# Patient Record
Sex: Male | Born: 1995
Health system: Southern US, Community
[De-identification: ages and names within clinical notes are randomized; demographics above are authoritative.]

## PROBLEM LIST (undated history)

## (undated) DIAGNOSIS — Z8661 Personal history of infections of the central nervous system: Secondary | ICD-10-CM

## (undated) DIAGNOSIS — J4520 Mild intermittent asthma, uncomplicated: Secondary | ICD-10-CM

## (undated) DIAGNOSIS — F909 Attention-deficit hyperactivity disorder, unspecified type: Secondary | ICD-10-CM

## (undated) DIAGNOSIS — F419 Anxiety disorder, unspecified: Secondary | ICD-10-CM

## (undated) HISTORY — DX: Personal history of infections of the central nervous system: Z86.61

## (undated) HISTORY — DX: Attention-deficit hyperactivity disorder, unspecified type: F90.9

## (undated) HISTORY — DX: Anxiety disorder, unspecified: F41.9

## (undated) HISTORY — DX: Mild intermittent asthma, uncomplicated: J45.20

---
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
## 2013-01-08 DIAGNOSIS — J45909 Unspecified asthma, uncomplicated: Secondary | ICD-10-CM | POA: Insufficient documentation

## 2013-01-08 DIAGNOSIS — R070 Pain in throat: Secondary | ICD-10-CM | POA: Insufficient documentation

## 2013-01-08 DIAGNOSIS — J029 Acute pharyngitis, unspecified: Secondary | ICD-10-CM | POA: Insufficient documentation

## 2013-01-08 DIAGNOSIS — Z8661 Personal history of infections of the central nervous system: Secondary | ICD-10-CM | POA: Insufficient documentation

## 2013-01-08 DIAGNOSIS — R498 Other voice and resonance disorders: Secondary | ICD-10-CM | POA: Insufficient documentation

## 2013-01-08 NOTE — ED Notes (Signed)
Pt states that he had a pork chop a couple days ago and it feels like a piece of bone is in there. No distress.

## 2013-01-09 ENCOUNTER — Emergency Department (HOSPITAL_BASED_OUTPATIENT_CLINIC_OR_DEPARTMENT_OTHER): Payer: BC Managed Care – PPO

## 2013-01-09 ENCOUNTER — Encounter (HOSPITAL_BASED_OUTPATIENT_CLINIC_OR_DEPARTMENT_OTHER): Payer: Self-pay | Admitting: *Deleted

## 2013-01-09 ENCOUNTER — Emergency Department (HOSPITAL_BASED_OUTPATIENT_CLINIC_OR_DEPARTMENT_OTHER)
Admission: EM | Admit: 2013-01-09 | Discharge: 2013-01-09 | Disposition: A | Discharge status: Home / Self Care | Payer: BC Managed Care – PPO | Attending: Emergency Medicine | Admitting: Emergency Medicine

## 2013-01-09 DIAGNOSIS — R070 Pain in throat: Secondary | ICD-10-CM

## 2013-01-09 NOTE — ED Notes (Signed)
MD at bedside. 

## 2013-01-09 NOTE — ED Notes (Signed)
Patient back form Xray

## 2013-01-09 NOTE — ED Provider Notes (Addendum)
History    This chart was scribed for John Sprout, MD scribed by Magnus Sinning. The patient was seen in room MH10/MH10 at 00:48   CSN: [id number]  Arrival date & time 01/08/13  2345   None     Chief Complaint  Patient presents with  . Foreign Body    (Consider location/radiation/quality/duration/timing/severity/associated sxs/prior treatment) Patient is a 17 y.o. male presenting with foreign body. The history is provided by the patient. No language interpreter was used.  Foreign Body  Associated symptoms include sore throat. Pertinent negatives include no trouble swallowing.   John Kline is a 17 y.o. male who presents to the Emergency Department complaining of one day of mild throat soreness located on the left side due to incident where a piece of a pork chop was briefly stuck in throat when eating two days ago. He says he did not choke, but notes that he began having throat soreness the following day.He explains that he has felt that throat is scratched, which has caused difficulties in loud vocalization, as he is a Psychologist, occupational. He denies any difficulties swallowing or drinking. Past Medical History  Diagnosis Date  . Asthma   . Meningitis     History reviewed. No pertinent past surgical history.  History reviewed. No pertinent family history.  History  Substance Use Topics  . Smoking status: Not on file  . Smokeless tobacco: Not on file  . Alcohol Use: Not on file      Review of Systems  HENT: Positive for sore throat and voice change. Negative for trouble swallowing.    10 Systems reviewed and are negative for acute change except as noted in the HPI. Allergies  Penicillins and Sulfa antibiotics  Home Medications  No current outpatient prescriptions on file.  BP 135/75  Pulse 62  Temp(Src) 98.1 F (36.7 C) (Oral)  Resp 18  Ht 5\' 10"  (1.778 m)  Wt 185 lb (83.915 kg)  BMI 26.54 kg/m2  SpO2 95%  Physical Exam  Nursing note and vitals  reviewed. Constitutional: He is oriented to person, place, and time. He appears well-developed and well-nourished. No distress.  HENT:  Head: Normocephalic and atraumatic.  Eyes: Conjunctivae and EOM are normal.  Neck: Neck supple. No tracheal deviation present.  Mild tenderness to anterior neck to the left side of the cricoid.  Cardiovascular: Normal rate.   Pulmonary/Chest: Effort normal and breath sounds normal. No stridor. No respiratory distress.  Abdominal: He exhibits no distension.  Musculoskeletal: Normal range of motion.  Lymphadenopathy:    He has no cervical adenopathy.  Neurological: He is alert and oriented to person, place, and time. No sensory deficit.  Skin: Skin is dry.  Psychiatric: He has a normal mood and affect. His behavior is normal.    ED Course  Procedures (including critical care time) DIAGNOSTIC STUDIES: Oxygen Saturation is 95% on room air, adequate by my interpretation.    COORDINATION OF CARE:  00:49: Physical exam performed.  Labs Reviewed - No data to display Dg Neck Soft Tissue  01/09/2013  *RADIOLOGY REPORT*  Clinical Data: Foreign body.  The patient had a pork chop a couple of days ago and feels like a piece of bone remaining.  NECK SOFT TISSUES - 1+ VIEW  Comparison: Cervical spine 08/26/2009  Findings: Lateral soft tissue exposure of the neck demonstrates no radiopaque foreign bodies.  Epiglottis and aryepiglottic folds are not enlarged.  No prevertebral or submental soft tissue swelling. Visualized bones appear intact.  Or pharyngeal and  tracheal airway appears intact on single view.  IMPRESSION: No radiopaque foreign bodies demonstrated in the neck.   Original Report Authenticated By: Burman Nieves, M.D.      1. Pain in throat       MDM   Patient was eating a pork chop last night he states that he swallowed it felt like there may have been a bone in it which scratched his throat. He denied any choking or breathing difficulty. There was  mild pain last night but today he has had more significant pain in his throat with swallowing and yelling. He is in no acute distress and has some mild pain in his anterior neck just left of this cricoid.  No stridor or other concerning features such as drooling or inability to swallow. He'll most likely patient has a scratch in his esophagus from progress last night I will just need time to heal. Soft tissue neck pending  1:19 AM Soft tissue neck within normal limits and patient discharged home. I personally performed the services described in this documentation, which was scribed in my presence.  The recorded information has been reviewed and considered.         John Sprout, MD 01/09/13 2956  John Sprout, MD 01/09/13 2130  John Sprout, MD 01/09/13 8657

## 2013-03-08 ENCOUNTER — Institutional Professional Consult (permissible substitution): Payer: BC Managed Care – PPO | Admitting: Family

## 2013-03-08 DIAGNOSIS — F909 Attention-deficit hyperactivity disorder, unspecified type: Secondary | ICD-10-CM

## 2013-05-18 ENCOUNTER — Institutional Professional Consult (permissible substitution): Payer: BC Managed Care – PPO | Admitting: Family

## 2013-05-18 DIAGNOSIS — F909 Attention-deficit hyperactivity disorder, unspecified type: Secondary | ICD-10-CM

## 2013-06-28 ENCOUNTER — Ambulatory Visit: Payer: BC Managed Care – PPO | Admitting: Psychologist

## 2013-08-09 ENCOUNTER — Ambulatory Visit: Payer: BC Managed Care – PPO | Admitting: Psychologist

## 2013-08-09 ENCOUNTER — Institutional Professional Consult (permissible substitution): Payer: BC Managed Care – PPO | Admitting: Family

## 2013-09-23 ENCOUNTER — Institutional Professional Consult (permissible substitution): Payer: BC Managed Care – PPO | Admitting: Family

## 2013-09-23 DIAGNOSIS — F909 Attention-deficit hyperactivity disorder, unspecified type: Secondary | ICD-10-CM

## 2014-02-14 ENCOUNTER — Institutional Professional Consult (permissible substitution): Payer: BC Managed Care – PPO | Admitting: Family

## 2014-02-16 ENCOUNTER — Institutional Professional Consult (permissible substitution): Payer: BC Managed Care – PPO | Admitting: Family

## 2014-02-16 DIAGNOSIS — F909 Attention-deficit hyperactivity disorder, unspecified type: Secondary | ICD-10-CM

## 2014-05-10 ENCOUNTER — Institutional Professional Consult (permissible substitution): Payer: BC Managed Care – PPO | Admitting: Family

## 2014-05-10 DIAGNOSIS — F909 Attention-deficit hyperactivity disorder, unspecified type: Secondary | ICD-10-CM

## 2014-05-10 DIAGNOSIS — F411 Generalized anxiety disorder: Secondary | ICD-10-CM

## 2014-09-01 ENCOUNTER — Institutional Professional Consult (permissible substitution): Payer: BC Managed Care – PPO | Admitting: Family

## 2014-09-01 DIAGNOSIS — F900 Attention-deficit hyperactivity disorder, predominantly inattentive type: Secondary | ICD-10-CM

## 2014-10-09 ENCOUNTER — Encounter: Payer: Self-pay | Admitting: Family Medicine

## 2014-10-09 ENCOUNTER — Ambulatory Visit (INDEPENDENT_AMBULATORY_CARE_PROVIDER_SITE_OTHER): Payer: 59 | Admitting: Family Medicine

## 2014-10-09 VITALS — BP 133/85 | HR 104 | Temp 98.8°F | Resp 18 | Ht 69.75 in | Wt 207.0 lb

## 2014-10-09 DIAGNOSIS — F909 Attention-deficit hyperactivity disorder, unspecified type: Secondary | ICD-10-CM

## 2014-10-09 DIAGNOSIS — J4520 Mild intermittent asthma, uncomplicated: Secondary | ICD-10-CM

## 2014-10-09 NOTE — Progress Notes (Signed)
Office Note 10/09/2014  CC:  Chief Complaint  Patient presents with  . Establish Care   HPI:  John Kline is a 19 y.o. White male who is here to establish care. Patient's most recent primary MD: Dr. Excell Seltzer in [facility] (pediatrician).  ADHD managed by behavioral peds MD in [place]. Old records not reviewed before or during visit today.  He is without acute complaint.  Reviewed history. No asthmatic sx's in quite some time, says he has inhaler at home if he needs it.  Past Medical History  Diagnosis Date  . Mild intermittent asthma     childhood; quiescent for the most part as adult  . History of meningitis age 71    viral  . ADHD (attention deficit hyperactivity disorder)     dx'd approx 8th grade    History reviewed. No pertinent past surgical history.  Family History  Problem Relation Age of Onset  . Uterine cancer Maternal Grandmother   . Graves' disease Mother     History   Social History  . Marital Status: Single    Spouse Name: N/A    Number of Children: N/A  . Years of Education: N/A   Occupational History  . Not on file.   Social History Main Topics  . Smoking status: Former Smoker    Types: Cigarettes  . Smokeless tobacco: Never Used  . Alcohol Use: No  . Drug Use: No  . Sexual Activity: Not on file   Other Topics Concern  . Not on file   Social History Narrative   Student at [employer] from [employer] in [place].   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   No T/A/Ds.   Little brother, twin sister, and older sister.    Outpatient Encounter Prescriptions as of 10/09/2014  Medication Sig  . lisdexamfetamine (VYVANSE) 70 MG capsule Take 70 mg by mouth daily.  Marland Kitchen VYVANSE 40 MG capsule 40 mg daily.  70 mg qAM and 40 mg q 3PM  Allergies  Allergen Reactions  . Amoxicillin   . Penicillins Rash  . Sulfa Antibiotics Rash   ROS Review of Systems  Constitutional: Negative for fever and fatigue.  HENT: Negative for  congestion and sore throat.   Eyes: Negative for visual disturbance.  Respiratory: Negative for cough.   Cardiovascular: Negative for chest pain.  Gastrointestinal: Negative for nausea and abdominal pain.  Genitourinary: Negative for dysuria.  Musculoskeletal: Negative for back pain and joint swelling.  Skin: Negative for rash.  Neurological: Negative for weakness and headaches.  Hematological: Negative for adenopathy.     PE; Blood pressure 133/85, pulse 104, temperature 98.8 F (37.1 C), temperature source Temporal, resp. rate 18, height 5' 9.75" (1.772 m), weight 207 lb (93.895 kg), SpO2 99 %. Gen: Alert, well appearing.  Patient is oriented to person, place, time, and situation. ZOX:WRUE: no injection, icteris, swelling, or exudate.  EOMI, PERRLA. Mouth: lips without lesion/swelling.  Oral mucosa pink and moist. Oropharynx without erythema, exudate, or swelling.  CV: RRR, no m/r/g.   LUNGS: CTA bilat, nonlabored resps, good aeration in all lung fields. EXT: no clubbing, cyanosis, or edema.   Pertinent labs:  none  ASSESSMENT AND PLAN:   New pt: establishing care.  Will obtain vaccine records, pertinent records from behav pediatrician managing his ADHD.  1) Mild intermittent asthma: quiescent. He has albuterol inhaler on hand if he needs it.  2) ADHD, managed by behavioral pediatrician in [place] and he'll be sticking  with this MD for this. No changes made to regimen, no new rx's today.  Pt declined flu vaccine today.  F/u: prn (needs CPE q1-2 yrs).

## 2014-10-09 NOTE — Progress Notes (Signed)
Pre visit review using our clinic review tool, if applicable. No additional management support is needed unless otherwise documented below in the visit note. 

## 2014-12-11 ENCOUNTER — Other Ambulatory Visit: Payer: Self-pay | Admitting: Family Medicine

## 2014-12-11 NOTE — Telephone Encounter (Signed)
Pt needs a refill on his inhaler. Please call it in.

## 2014-12-11 NOTE — Telephone Encounter (Signed)
Patient does not have an inhaler in med list. LMOVM for call back.

## 2014-12-12 MED ORDER — ALBUTEROL SULFATE HFA 108 (90 BASE) MCG/ACT IN AERS: 1.0000 | INHALATION_SPRAY | Freq: Four times a day (QID) | RESPIRATORY_TRACT | Status: DC | PRN

## 2014-12-12 NOTE — Telephone Encounter (Signed)
Patient's mom Olegario MessierKathy, returned call. Olegario MessierKathy stated that pt uses proair inhaler and is needing refill. Med never filled by provider. Please advise refill?

## 2015-04-10 ENCOUNTER — Institutional Professional Consult (permissible substitution): Payer: 59 | Admitting: Family

## 2015-04-10 DIAGNOSIS — F902 Attention-deficit hyperactivity disorder, combined type: Secondary | ICD-10-CM | POA: Diagnosis not present

## 2015-09-25 ENCOUNTER — Institutional Professional Consult (permissible substitution): Payer: 59 | Admitting: Family

## 2015-09-25 DIAGNOSIS — F902 Attention-deficit hyperactivity disorder, combined type: Secondary | ICD-10-CM | POA: Diagnosis not present

## 2015-09-25 DIAGNOSIS — F411 Generalized anxiety disorder: Secondary | ICD-10-CM | POA: Diagnosis not present

## 2015-12-11 ENCOUNTER — Ambulatory Visit (INDEPENDENT_AMBULATORY_CARE_PROVIDER_SITE_OTHER): Payer: BLUE CROSS/BLUE SHIELD | Admitting: Family

## 2015-12-11 ENCOUNTER — Encounter: Payer: Self-pay | Admitting: Family

## 2015-12-11 VITALS — BP 104/72 | HR 68 | Resp 20 | Ht 69.75 in | Wt 175.0 lb

## 2015-12-11 DIAGNOSIS — F419 Anxiety disorder, unspecified: Secondary | ICD-10-CM | POA: Diagnosis not present

## 2015-12-11 DIAGNOSIS — F329 Major depressive disorder, single episode, unspecified: Secondary | ICD-10-CM

## 2015-12-11 DIAGNOSIS — F909 Attention-deficit hyperactivity disorder, unspecified type: Secondary | ICD-10-CM | POA: Insufficient documentation

## 2015-12-11 DIAGNOSIS — F902 Attention-deficit hyperactivity disorder, combined type: Secondary | ICD-10-CM | POA: Diagnosis not present

## 2015-12-11 DIAGNOSIS — F32A Depression, unspecified: Secondary | ICD-10-CM | POA: Insufficient documentation

## 2015-12-11 MED ORDER — AMPHETAMINE-DEXTROAMPHET ER 25 MG PO CP24: 25.0000 mg | ORAL_CAPSULE | Freq: Every evening | ORAL | Status: DC

## 2015-12-11 MED ORDER — AMPHETAMINE-DEXTROAMPHET ER 30 MG PO CP24: 30.0000 mg | ORAL_CAPSULE | Freq: Every morning | ORAL | Status: DC

## 2015-12-11 NOTE — Progress Notes (Signed)
[facility] [facility] [place] [facility] [address] [place] [address] Dept: [phone] Dept Fax: [phone] Loc: [phone] Loc Fax: [phone]  Medical Follow-up  Patient ID: John Kline, male  DOB: 11/28/1995, 20 y.o.  MRN: [id number]  Date of Evaluation: 12/11/15  PCP: Jeoffrey Massed, MD  Accompanied by: Self Patient Lives with: self  and roommate in dorm room HISTORY/CURRENT STATUS:  HPI  Patient here for routine follow up related to ADHD and medication management.  EDUCATION: School: [employer], [place] Year/Grade: Junior Homework Time: 2 Hours or more depending on class work Performance/Grades: above average Services: Other: Tutoring if needed Activities/Exercise: Current Exercise Habits: Home exercise routine, Type of exercise: calisthenics;treadmill, Time (Minutes): 30, Frequency (Times/Week): 6, Weekly Exercise (Minutes/Week): 180, Intensity: Moderate Exercise limited by: None identified   MEDICAL HISTORY: Appetite: good, more healthy choices and decreased carbohydrate intake MVI/Other: None Fruits/Vegs:increased recently Calcium: daily Iron:daily  Sleep: Bedtime: 11:30-12:00 am  Awakens: 7:30-8:00 am Sleep Concerns: Initiation/Maintenance/Other: Better sleep schedule  Individual Medical History/Review of System Changes? Yes food poisoning at school with illness in January for 3-4 days.  Allergies: Amoxicillin; Penicillins; and Sulfa antibiotics  Current Medications:  Current outpatient prescriptions:  .  amphetamine-dextroamphetamine (ADDERALL XR) 25 MG 24 hr capsule, Take 1 capsule by mouth every evening., Disp: 30 capsule, Rfl: 0 .  amphetamine-dextroamphetamine (ADDERALL XR) 30 MG 24 hr capsule, Take 1 capsule (30 mg total) by mouth every morning., Disp: 30 capsule, Rfl: 0 Medication Side Effects: None  Family Medical/Social  History Changes?: No  MENTAL HEALTH: Mental Health Issues: Recent break up with girlfriend that has been difficult  PHYSICAL EXAM: Vitals:  Today's Vitals   12/11/15 1446  BP: 104/72  Pulse: 68  Resp: 20  Height: 5' 9.75" (1.772 m)  Weight: 175 lb (79.379 kg)  , Facility age limit for growth percentiles is 20 years.  General Exam: Physical Exam  Constitutional: He is oriented to person, place, and time. He appears well-developed and well-nourished.  HENT:  Head: Normocephalic and atraumatic.  Right Ear: External ear normal.  Left Ear: External ear normal.  Nose: Nose normal.  Mouth/Throat: Oropharynx is clear and moist.  Eyes: Conjunctivae and EOM are normal. Pupils are equal, round, and reactive to light.  Neck: Normal range of motion. Neck supple.  Cardiovascular: Normal rate, regular rhythm, normal heart sounds and intact distal pulses.   Pulmonary/Chest: Effort normal and breath sounds normal.  Abdominal: Soft. Bowel sounds are normal.  Musculoskeletal: Normal range of motion.  Neurological: He is alert and oriented to person, place, and time. He has normal reflexes.  Skin: Skin is warm and dry.  Psychiatric: He has a normal mood and affect. His behavior is normal. Judgment and thought content normal.    Neurological: oriented to time, place, and person Cranial Nerves: normal  Neuromuscular:  Motor Mass: normal Tone: normal Strength: normal DTRs: 2+ and symmetric Overflow: none Reflexes: no tremors noted Sensory Exam: Vibratory: intact  Fine Touch: intact  Testing/Developmental Screens:  ASRS-9/9  DIAGNOSES:    ICD-9-CM ICD-10-CM   1. ADHD (attention deficit hyperactivity disorder), combined type 314.01 F90.2   2. Single current episode of major depressive disorder, unspecified depression episode severity (HCC) 296.20 F32.9   3. Anxiety disorder, unspecified 300.00 F41.9   4. Depression 311 F32.9     RECOMMENDATIONS: follow up when available for college  schedule  NEXT APPOINTMENT: Return in about 6 months (around 06/12/2016)  for Follow up appointment.   Carron Curieawn M. Paretta-Leahey, NP Counseling Time:30 mins Total Contact Time: 40 mins

## 2015-12-11 NOTE — Patient Instructions (Signed)
Follow up with counseling services as needed. Application for emotional support animal with type of documentation along with information to provide to college.

## 2016-01-24 ENCOUNTER — Other Ambulatory Visit: Payer: Self-pay | Admitting: Family

## 2016-01-24 MED ORDER — AMPHETAMINE-DEXTROAMPHET ER 30 MG PO CP24: 30.0000 mg | ORAL_CAPSULE | Freq: Every morning | ORAL | Status: DC

## 2016-01-24 MED ORDER — AMPHETAMINE-DEXTROAMPHET ER 25 MG PO CP24: 25.0000 mg | ORAL_CAPSULE | Freq: Every evening | ORAL | Status: DC

## 2016-01-24 NOTE — Telephone Encounter (Signed)
Mom called for refills for both Adderall prescriptions.  Patient last seen 12/11/15.  I called regarding a follow-up appointment, and mom said John Kline is having to work at school (in [place] through the summer and has made arrangements with the provider to defer a return appointment until November 2017.

## 2016-01-24 NOTE — Telephone Encounter (Signed)
Adderall XR 30 mg and Adderall XR 25 mg, 30 of each dispensed, 30 mg every morning and 25 mg every afternoon

## 2016-02-22 ENCOUNTER — Other Ambulatory Visit: Payer: Self-pay | Admitting: Family

## 2016-02-22 MED ORDER — AMPHETAMINE-DEXTROAMPHET ER 25 MG PO CP24: 25.0000 mg | ORAL_CAPSULE | Freq: Every evening | ORAL | Status: DC

## 2016-02-22 MED ORDER — AMPHETAMINE-DEXTROAMPHET ER 30 MG PO CP24: 30.0000 mg | ORAL_CAPSULE | Freq: Every morning | ORAL | Status: DC

## 2016-02-22 NOTE — Telephone Encounter (Signed)
Printed Rx and placed at front desk for pick-up-Adderall XR 30 mg and 25 mg. 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

## 2016-02-22 NOTE — Telephone Encounter (Addendum)
Mom called for refill for both Adderall prescriptions.  Patient last seen 12/11/15.  Left message for mom to call and schedule follow-up bas soon as possible. Mom called back and said the provider agreed to see the patient in December.

## 2016-03-24 ENCOUNTER — Other Ambulatory Visit: Payer: Self-pay | Admitting: Family

## 2016-03-24 MED ORDER — AMPHETAMINE-DEXTROAMPHET ER 30 MG PO CP24: 30.0000 mg | ORAL_CAPSULE | Freq: Every morning | ORAL | Status: DC

## 2016-03-24 MED ORDER — AMPHETAMINE-DEXTROAMPHET ER 25 MG PO CP24: 25.0000 mg | ORAL_CAPSULE | Freq: Every evening | ORAL | Status: DC

## 2016-03-24 NOTE — Telephone Encounter (Signed)
Mom called for both doses of Adderall.  Patient last seen 12/11/15.  Per mom, provider has spoken with patient about return appointment.

## 2016-03-24 NOTE — Telephone Encounter (Signed)
Spoke with provider. 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 He will be seen when he returns Printed Rx for Adderall XR 30 mg and Adderall XR 25 and placed at front desk for pick-up

## 2016-04-28 ENCOUNTER — Other Ambulatory Visit: Payer: Self-pay | Admitting: Family

## 2016-04-28 MED ORDER — AMPHETAMINE-DEXTROAMPHET ER 25 MG PO CP24: 25.0000 mg | ORAL_CAPSULE | Freq: Every evening | ORAL | 0 refills | Status: DC

## 2016-04-28 MED ORDER — AMPHETAMINE-DEXTROAMPHET ER 30 MG PO CP24: 30.0000 mg | ORAL_CAPSULE | Freq: Every morning | ORAL | 0 refills | Status: DC

## 2016-04-28 NOTE — Telephone Encounter (Signed)
Mom called for refill, did not specify medication.  Patient last seen 12/14/15.  Called mom, who said patient is in college in [place] and will not be home until around Thanksgiving.  She said the patient is in communication with the provider and will call to schedule his next appointment.

## 2016-04-28 NOTE — Telephone Encounter (Signed)
Printed Rx and placed at front desk for pick-up-Adderall XrR30 mg and 25 mg daily

## 2016-05-22 ENCOUNTER — Other Ambulatory Visit: Payer: Self-pay | Admitting: Family

## 2016-05-22 MED ORDER — AMPHETAMINE-DEXTROAMPHET ER 30 MG PO CP24: 30.0000 mg | ORAL_CAPSULE | Freq: Every morning | ORAL | 0 refills | Status: DC

## 2016-05-22 MED ORDER — AMPHETAMINE-DEXTROAMPHET ER 25 MG PO CP24: 25.0000 mg | ORAL_CAPSULE | Freq: Every evening | ORAL | 0 refills | Status: DC

## 2016-05-22 NOTE — Telephone Encounter (Signed)
Mom called for refill, did not specify medication.  Patient last seen 12/11/15.  Patient is in college in [place].

## 2016-05-22 NOTE — Telephone Encounter (Signed)
Printed Rx and placed at front desk for pick-up  

## 2016-06-27 ENCOUNTER — Other Ambulatory Visit: Payer: Self-pay | Admitting: Family

## 2016-06-27 MED ORDER — AMPHETAMINE-DEXTROAMPHET ER 25 MG PO CP24: 25.0000 mg | ORAL_CAPSULE | Freq: Every evening | ORAL | 0 refills | Status: DC

## 2016-06-27 MED ORDER — AMPHETAMINE-DEXTROAMPHET ER 30 MG PO CP24: 30.0000 mg | ORAL_CAPSULE | Freq: Every morning | ORAL | 0 refills | Status: DC

## 2016-06-27 NOTE — Telephone Encounter (Signed)
Mom called for refill, did not specify medication.  Patient last seen 12/11/15, next appointment 09/24/16.

## 2016-06-27 NOTE — Telephone Encounter (Signed)
Printed Rx and placed at front desk for pick-up-Adderall XR 25 mg and 30 mg 1 each daily.

## 2016-07-28 ENCOUNTER — Other Ambulatory Visit: Payer: Self-pay | Admitting: Family

## 2016-07-28 NOTE — Telephone Encounter (Signed)
Mom called for refills for both prescriptions.  Patient last seen 12/11/15, next appointment 09/24/16.

## 2016-07-29 MED ORDER — AMPHETAMINE-DEXTROAMPHET ER 25 MG PO CP24: 25.0000 mg | ORAL_CAPSULE | Freq: Every evening | ORAL | 0 refills | Status: DC

## 2016-07-29 MED ORDER — AMPHETAMINE-DEXTROAMPHET ER 30 MG PO CP24: 30.0000 mg | ORAL_CAPSULE | Freq: Every morning | ORAL | 0 refills | Status: DC

## 2016-07-29 NOTE — Telephone Encounter (Signed)
Printed Rx for Adderall XR 30 Q AM and Adderall XR 25 Q PM  and placed at front desk for pick-up. Patient has not been seen since 12/2015 and needs scheduled for an appointment

## 2016-08-21 ENCOUNTER — Other Ambulatory Visit: Payer: Self-pay | Admitting: Family

## 2016-08-21 NOTE — Telephone Encounter (Signed)
Mom called for refill for both Adderall prescriptions.  Patient last seen 12/11/15, next appointment 09/24/16.

## 2016-08-22 MED ORDER — AMPHETAMINE-DEXTROAMPHET ER 30 MG PO CP24: 30.0000 mg | ORAL_CAPSULE | Freq: Every morning | ORAL | 0 refills | Status: DC

## 2016-08-22 MED ORDER — AMPHETAMINE-DEXTROAMPHET ER 25 MG PO CP24: 25.0000 mg | ORAL_CAPSULE | Freq: Every evening | ORAL | 0 refills | Status: DC

## 2016-08-22 NOTE — Telephone Encounter (Signed)
Printed Rx and placed at front desk for pick-up-Adderall XR 25 mg and 30 mg each daily. Patient in [place] for college and has appt. Schedule for 09/24/16.

## 2016-09-19 ENCOUNTER — Telehealth: Payer: Self-pay | Admitting: Family

## 2016-09-19 ENCOUNTER — Institutional Professional Consult (permissible substitution): Payer: Self-pay | Admitting: Family

## 2016-09-19 NOTE — Telephone Encounter (Signed)
Mom called and left a  message that Sheria LangCameron had to  canceled due to emergency.

## 2016-09-23 ENCOUNTER — Encounter: Payer: Self-pay | Admitting: Family

## 2016-09-23 ENCOUNTER — Ambulatory Visit (INDEPENDENT_AMBULATORY_CARE_PROVIDER_SITE_OTHER): Payer: BLUE CROSS/BLUE SHIELD | Admitting: Family

## 2016-09-23 VITALS — BP 112/64 | HR 68 | Ht 69.75 in | Wt 165.2 lb

## 2016-09-23 DIAGNOSIS — F902 Attention-deficit hyperactivity disorder, combined type: Secondary | ICD-10-CM | POA: Diagnosis not present

## 2016-09-23 DIAGNOSIS — F419 Anxiety disorder, unspecified: Secondary | ICD-10-CM | POA: Diagnosis not present

## 2016-09-23 MED ORDER — AMPHETAMINE-DEXTROAMPHET ER 30 MG PO CP24: 30.0000 mg | ORAL_CAPSULE | Freq: Every morning | ORAL | 0 refills | Status: DC

## 2016-09-23 MED ORDER — AMPHETAMINE-DEXTROAMPHET ER 25 MG PO CP24: 25.0000 mg | ORAL_CAPSULE | Freq: Every evening | ORAL | 0 refills | Status: DC

## 2016-09-23 NOTE — Progress Notes (Signed)
[place] [facility] [place] [facility] [address] [place] [address] Dept: [phone] Dept Fax: [phone] Loc: [phone] Loc Fax: [phone]  Medical Follow-up  Patient ID: John Kline, male  DOB: 01/16/1996, 20 y.o.  MRN: [id number]  Date of Evaluation: 09/23/16  PCP: Jeoffrey MassedMCGOWEN,PHILIP H, MD  Accompanied by: Self Patient Lives with: Dorm room on campus  HISTORY/CURRENT STATUS:  HPI  Patient here for routine follow up related to ADHD and medication management. Patient here for today's follow up visit by himself today. Doing well at school and graduating in April. No side effects reports from medication regimen.   EDUCATION: School: [employer], [place] Year/Grade: Senior year Homework Time: Depends on class demands. Graduation is January 29, 2017. Performance/Grades: above average Services: Other: Tutoring if needed on campus Activities/Exercise: daily-working out routinely and ran 1st 5K. Running and gym to workout.  MEDICAL HISTORY: Appetite: Good, decreased carbs and protein. MVI/Other: None Fruits/Vegs:Some Calcium: Some Iron:Some  Sleep: Bedtime: 11:00 pm Awakens: 8:30 am Sleep Concerns: Initiation/Maintenance/Other: No problems reported  Individual Medical History/Review of System Changes? None reported recently.   Allergies: Amoxicillin; Penicillins; and Sulfa antibiotics  Current Medications:  Current Outpatient Prescriptions:  .  amphetamine-dextroamphetamine (ADDERALL XR) 25 MG 24 hr capsule, Take 1 capsule by mouth every evening., Disp: 30 capsule, Rfl: 0 .  amphetamine-dextroamphetamine (ADDERALL XR) 30 MG 24 hr capsule, Take 1 capsule (30 mg total) by mouth every morning., Disp: 30 capsule, Rfl: 0 Medication Side Effects: None  Family Medical/Social History Changes?: None reported recently.   MENTAL HEALTH: Mental Health  Issues: Anxiety-recently increased  PHYSICAL EXAM: Vitals:  Today's Vitals   09/23/16 1113  BP: 112/64  Pulse: 68  Weight: 165 lb 3.2 oz (74.9 kg)  Height: 5' 9.75" (1.772 m)  PainSc: 0-No pain  , Facility age limit for growth percentiles is 20 years.  General Exam: Physical Exam  Constitutional: He is oriented to person, place, and time. He appears well-developed and well-nourished.  HENT:  Head: Normocephalic and atraumatic.  Right Ear: External ear normal.  Left Ear: External ear normal.  Nose: Nose normal.  Mouth/Throat: Oropharynx is clear and moist.  Eyes: Conjunctivae and EOM are normal. Pupils are equal, round, and reactive to light.  Neck: Trachea normal, normal range of motion and full passive range of motion without pain. Neck supple.  Cardiovascular: Normal rate, regular rhythm, normal heart sounds and intact distal pulses.   Pulmonary/Chest: Effort normal and breath sounds normal.  Abdominal: Soft. Bowel sounds are normal.  Musculoskeletal: Normal range of motion.  Neurological: He is alert and oriented to person, place, and time. He has normal reflexes.  Skin: Skin is warm, dry and intact. Capillary refill takes less than 2 seconds.  Psychiatric: He has a normal mood and affect. His behavior is normal. Judgment and thought content normal.  Vitals reviewed.  No concerns for toileting. Daily stool, no constipation or diarrhea. Void urine no difficulty. No enuresis.   Participate in daily oral hygiene to include brushing and flossing.  Neurological: oriented to time, place, and person Cranial Nerves: normal  Neuromuscular:  Motor Mass: Normal Tone: Normal Strength: Normal DTRs: 2+ and symmetric Overflow: None Reflexes: no tremors noted Sensory Exam: Vibratory: Intact  Fine Touch: Intact  Testing/Developmental Screens:  ASRS-11/11 scored by patient and reviewed    DIAGNOSES:    ICD-9-CM ICD-10-CM   1. ADHD (attention deficit hyperactivity disorder),  combined type 314.01 F90.2  2. Anxiety disorder, unspecified type 300.00 F41.9     RECOMMENDATIONS: 3-6 month follow up and continuation of medication.  Adderall XR 30 mg and 25 mg 1 each daily, # 30 script printed and given to patient today.   Discussed exercise with healthy lifestyle changes and better eating habits over the past 6 months.   Continuation of daily oral hygiene to include flossing and brushing daily, using antimicrobial toothpaste, as well as routine dental exams and twice yearly cleaning.  Recommend supplementation with a multivitamin and omega-3 fatty acids daily.  Maintain adequate intake of Calcium and Vitamin D.  NEXT APPOINTMENT: Return in about 3 months (around 12/22/2016) for follow up visit.  More than 50% of the appointment was spent counseling and discussing diagnosis and management of symptoms with the patient and family.  Carron Curieawn M Paretta-Leahey, NP Counseling Time: 30 mins Total Contact Time: 40 mins

## 2016-09-24 ENCOUNTER — Institutional Professional Consult (permissible substitution): Payer: BLUE CROSS/BLUE SHIELD | Admitting: Family

## 2016-10-27 ENCOUNTER — Other Ambulatory Visit: Payer: Self-pay | Admitting: Family

## 2016-10-27 MED ORDER — AMPHETAMINE-DEXTROAMPHET ER 25 MG PO CP24: 25.0000 mg | ORAL_CAPSULE | Freq: Every evening | ORAL | 0 refills | Status: DC

## 2016-10-27 MED ORDER — AMPHETAMINE-DEXTROAMPHET ER 30 MG PO CP24: 30.0000 mg | ORAL_CAPSULE | Freq: Every morning | ORAL | 0 refills | Status: DC

## 2016-10-27 NOTE — Telephone Encounter (Signed)
Mom called for refill, did not specify medication.  Patient last seen 09/23/16, next appointment 02/02/17.  Do not mail, will pick up.

## 2016-10-27 NOTE — Telephone Encounter (Signed)
Printed Rx for Adderall XR 30 and Adderall XR 25 and placed at front desk for pick-up

## 2016-11-25 ENCOUNTER — Other Ambulatory Visit: Payer: Self-pay | Admitting: Family

## 2016-11-25 MED ORDER — AMPHETAMINE-DEXTROAMPHET ER 30 MG PO CP24: 30.0000 mg | ORAL_CAPSULE | Freq: Every morning | ORAL | 0 refills | Status: DC

## 2016-11-25 MED ORDER — AMPHETAMINE-DEXTROAMPHET ER 25 MG PO CP24: 25.0000 mg | ORAL_CAPSULE | Freq: Every evening | ORAL | 0 refills | Status: DC

## 2016-11-25 NOTE — Telephone Encounter (Signed)
Printed Rx for Adderall XR 30 mg and Adderall XR 25 mg and placed at front desk for pick-up

## 2016-11-25 NOTE — Telephone Encounter (Signed)
Mom called for refill for Adderall.  Patient last seen 09/23/16, next appointment 02/02/17.

## 2016-12-22 ENCOUNTER — Other Ambulatory Visit: Payer: Self-pay | Admitting: Family

## 2016-12-22 MED ORDER — AMPHETAMINE-DEXTROAMPHET ER 30 MG PO CP24: 30.0000 mg | ORAL_CAPSULE | Freq: Every morning | ORAL | 0 refills | Status: DC

## 2016-12-22 MED ORDER — AMPHETAMINE-DEXTROAMPHET ER 25 MG PO CP24: 25.0000 mg | ORAL_CAPSULE | Freq: Every evening | ORAL | 0 refills | Status: DC

## 2016-12-22 NOTE — Telephone Encounter (Signed)
Mom called for refill for Adderall.  Patient last seen 09/24/16, next appointment 02/02/17.

## 2016-12-22 NOTE — Telephone Encounter (Signed)
Printed Rx for Adderall XR 25 mg and 30 mg and placed at front desk for pick-up

## 2017-01-22 ENCOUNTER — Other Ambulatory Visit: Payer: Self-pay | Admitting: Family

## 2017-01-22 MED ORDER — AMPHETAMINE-DEXTROAMPHET ER 30 MG PO CP24: 30.0000 mg | ORAL_CAPSULE | Freq: Every morning | ORAL | 0 refills | Status: DC

## 2017-01-22 MED ORDER — AMPHETAMINE-DEXTROAMPHET ER 25 MG PO CP24: 25.0000 mg | ORAL_CAPSULE | Freq: Every evening | ORAL | 0 refills | Status: DC

## 2017-01-22 NOTE — Telephone Encounter (Signed)
TC from mother for refill on adderall 25 and 30 mg, printed and up front for mother to pick up

## 2017-01-22 NOTE — Telephone Encounter (Signed)
Mom called for refill for Adderall.  Patient last seen 09/23/16, next appointment 02/02/17.

## 2017-02-02 ENCOUNTER — Encounter: Payer: Self-pay | Admitting: Family

## 2017-02-02 ENCOUNTER — Ambulatory Visit (INDEPENDENT_AMBULATORY_CARE_PROVIDER_SITE_OTHER): Payer: BLUE CROSS/BLUE SHIELD | Admitting: Family

## 2017-02-02 VITALS — BP 112/68 | HR 76 | Ht 69.75 in | Wt 176.4 lb

## 2017-02-02 DIAGNOSIS — F419 Anxiety disorder, unspecified: Secondary | ICD-10-CM

## 2017-02-02 DIAGNOSIS — Z79899 Other long term (current) drug therapy: Secondary | ICD-10-CM | POA: Diagnosis not present

## 2017-02-02 DIAGNOSIS — F902 Attention-deficit hyperactivity disorder, combined type: Secondary | ICD-10-CM | POA: Diagnosis not present

## 2017-02-02 MED ORDER — AMPHETAMINE-DEXTROAMPHET ER 30 MG PO CP24: 30.0000 mg | ORAL_CAPSULE | Freq: Every morning | ORAL | 0 refills | Status: DC

## 2017-02-02 MED ORDER — AMPHETAMINE-DEXTROAMPHET ER 25 MG PO CP24: 25.0000 mg | ORAL_CAPSULE | Freq: Every evening | ORAL | 0 refills | Status: DC

## 2017-02-02 NOTE — Progress Notes (Signed)
[facility] [place] [address] [place] [place] [address] Dept: [phone] Dept Fax: [phone] Loc: [phone] Loc Fax: [phone]  Medical Follow-up  Patient ID: [place], male  DOB: 10/29/1995, 21 y.o.  MRN: [id number]  Date of Evaluation: 02/02/17  PCP: John Massed, MD  Accompanied by: Self Patient Lives with: mother and sister  HISTORY/CURRENT STATUS:  HPI  Patient here for routine follow up related to ADHD and medication management.Patient here by himself for today's follow up visit. Doing well and recently graduated from college this past Saturday. Back home at mother's house and will start working March 23, 2017 at [employer]. Has continued to take his medication regularly without any side effects reported, both Adderall XR 30 mg and 25 mg both daily. Has continued with physical activity routinely and watching what he is eating. [name] has kept his relationship with his [place] who is a Consulting civil engineer in [place] at school in her last year of college.   EDUCATION/WORK: School: [place] [place] Year/Grade: Senior-GRADUATED! Homework Time: None now Performance/Grades: above average Services: Other: None needed Activities/Exercise: every other day-running more, not much weight training recently.   MEDICAL HISTORY: Appetite: Good MVI/Other: None Fruits/Vegs:Some Calcium: Some Iron:Some  Sleep: Bedtime: 11:30 pm Awakens: 8:00 am Sleep Concerns: Initiation/Maintenance/Other: No problems reported.   Individual Medical History/Review of System Changes? Yes, had Mononucleosis recently in January with feeling better. No other recent health issues.   Allergies: Amoxicillin; Penicillins; and Sulfa antibiotics  Current Medications:  Current Outpatient Prescriptions:  .  amphetamine-dextroamphetamine (ADDERALL XR) 25 MG 24 hr capsule,  Take 1 capsule by mouth every evening., Disp: 30 capsule, Rfl: 0 .  amphetamine-dextroamphetamine (ADDERALL XR) 30 MG 24 hr capsule, Take 1 capsule (30 mg total) by mouth every morning., Disp: 30 capsule, Rfl: 0 Medication Side Effects: None  Family Medical/Social History Changes?: None   MENTAL HEALTH: Mental Health Issues: Anxiety-decreased now, but can increase with new job.   PHYSICAL EXAM: Vitals:  Today's Vitals   02/02/17 1436  BP: 112/68  Pulse: 76  Weight: 176 lb 6.4 oz (80 kg)  Height: 5' 9.75" (1.772 m)  PainSc: 0-No pain  , Facility age limit for growth percentiles is 20 years.  General Exam: Physical Exam  Constitutional: He is oriented to person, place, and time. He appears well-developed and well-nourished.  HENT:  Head: Normocephalic and atraumatic.  Right Ear: External ear normal.  Left Ear: External ear normal.  Nose: Nose normal.  Mouth/Throat: Oropharynx is clear and moist.  Eyes: Conjunctivae and EOM are normal. Pupils are equal, round, and reactive to light.  Neck: Trachea normal, normal range of motion and full passive range of motion without pain. Neck supple.  Cardiovascular: Normal rate, regular rhythm, normal heart sounds and intact distal pulses.   Pulmonary/Chest: Effort normal and breath sounds normal.  Abdominal: Soft. Bowel sounds are normal.  Musculoskeletal: Normal range of motion.  Neurological: He is alert and oriented to person, place, and time. He has normal reflexes.  Skin: Skin is warm, dry and intact. Capillary refill takes less than 2 seconds.  Psychiatric: He has a normal mood and affect. His behavior is normal. Judgment and thought content normal.  Vitals reviewed.  Review of Systems  Psychiatric/Behavioral: Positive for decreased concentration.  All other systems reviewed and are negative.  No concerns for toileting. Daily stool, no constipation or diarrhea. Void urine no difficulty. No enuresis.  Participate in daily oral  hygiene to include brushing and flossing.  Neurological: oriented to time, place, and person Cranial Nerves: normal  Neuromuscular:  Motor Mass: Normal Tone: Normal Strength: Normal DTRs: 2+ and symmetric Overflow: None Reflexes: no tremors noted Sensory Exam: Vibratory: Intact  Fine Touch: Intact  Testing/Developmental Screens:  ASRS-    DIAGNOSES:    ICD-9-CM ICD-10-CM   1. ADHD (attention deficit hyperactivity disorder), combined type 314.01 F90.2   2. Anxiety disorder, unspecified type 300.00 F41.9   3. Medication management V58.69 Z79.899     RECOMMENDATIONS: 3 month follow up and continuation of medication. Adderall XR 30 mg and 25 mg daily each day, # 30 each printed and given to patient today.  Advised patient to continue with medication as directed even with work to start in June.  Directed on following up with PCP for routine blood work and health maintenance.   Suggested continuation of physical activity regularly to maintain health and cardiovascular heath.   Counseled on sleep hygiene with change in schedule from school to work in the next few months.   Advised on [place] with long distance relationship and continuation of healthy interaction long distance.   NEXT APPOINTMENT: Return in about 3 months (around 05/04/2017) for follow up visit.  More than 50% of the appointment was spent counseling and discussing diagnosis and management of symptoms with the patient and family.  Carron Curie, NP Counseling Time: 30 mins Total Contact Time: 40 mins

## 2017-02-03 ENCOUNTER — Encounter: Payer: Self-pay | Admitting: Family Medicine

## 2017-03-24 ENCOUNTER — Other Ambulatory Visit: Payer: Self-pay | Admitting: Family

## 2017-03-24 MED ORDER — AMPHETAMINE-DEXTROAMPHET ER 25 MG PO CP24: 25.0000 mg | ORAL_CAPSULE | Freq: Every evening | ORAL | 0 refills | Status: DC

## 2017-03-24 MED ORDER — AMPHETAMINE-DEXTROAMPHET ER 30 MG PO CP24: 30.0000 mg | ORAL_CAPSULE | Freq: Every morning | ORAL | 0 refills | Status: DC

## 2017-03-24 NOTE — Telephone Encounter (Signed)
Mom called for refill, did not specify medication.  Patient last seen 02/02/17.

## 2017-03-24 NOTE — Telephone Encounter (Signed)
Printed Rx for Adderall XR 30 mg and Adderall XR 25 mg and placed at front desk for pick-up

## 2017-04-01 ENCOUNTER — Telehealth: Payer: Self-pay | Admitting: Family

## 2017-04-01 MED ORDER — AMPHETAMINE-DEXTROAMPHET ER 25 MG PO CP24: 25.0000 mg | ORAL_CAPSULE | Freq: Every evening | ORAL | 0 refills | Status: DC

## 2017-04-01 MED ORDER — AMPHETAMINE-DEXTROAMPHET ER 30 MG PO CP24: 30.0000 mg | ORAL_CAPSULE | Freq: Every morning | ORAL | 0 refills | Status: DC

## 2017-04-01 NOTE — Telephone Encounter (Signed)
Message on RN line needing 90 days scripts for mail in related to new job with change in insurance. To mail scripts to home address for Adderall XR 30 mg and 25 mg 1 each daily, # 90 for each medication with no refills.

## 2017-04-21 ENCOUNTER — Telehealth: Payer: Self-pay | Admitting: Family

## 2017-04-21 MED ORDER — AMPHETAMINE-DEXTROAMPHET ER 30 MG PO CP24: 30.0000 mg | ORAL_CAPSULE | Freq: Every morning | ORAL | 0 refills | Status: DC

## 2017-04-21 MED ORDER — AMPHETAMINE-DEXTROAMPHET ER 25 MG PO CP24: 25.0000 mg | ORAL_CAPSULE | Freq: Every evening | ORAL | 0 refills | Status: DC

## 2017-04-21 NOTE — Telephone Encounter (Signed)
Printed Rx and placed at front desk for pick-up-Adderall XR 30 mg and 25 mg each daily.

## 2017-05-19 ENCOUNTER — Telehealth: Payer: Self-pay | Admitting: Pediatrics

## 2017-05-19 NOTE — Telephone Encounter (Signed)
PA submitted via Cover My Meds.  Approved for 30 mg, initially denied for 25 mg 9 may not need second dose PA)

## 2017-07-13 ENCOUNTER — Other Ambulatory Visit: Payer: Self-pay | Admitting: Family

## 2017-07-13 MED ORDER — AMPHETAMINE-DEXTROAMPHET ER 30 MG PO CP24: 30.0000 mg | ORAL_CAPSULE | Freq: Every morning | ORAL | 0 refills | Status: DC

## 2017-07-13 MED ORDER — AMPHETAMINE-DEXTROAMPHET ER 25 MG PO CP24: 25.0000 mg | ORAL_CAPSULE | Freq: Every evening | ORAL | 0 refills | Status: DC

## 2017-07-13 NOTE — Telephone Encounter (Signed)
Patient called for refill for Adderall.  Patient last see 02/02/17, next appointment 09/15/17.

## 2017-07-13 NOTE — Telephone Encounter (Signed)
Printed Rx for Adderall XR 25 mg and Adderal XR 30 mg (90 days supply)  and placed at front desk for pick-up

## 2017-09-15 ENCOUNTER — Institutional Professional Consult (permissible substitution): Payer: 59 | Admitting: Family

## 2017-10-05 ENCOUNTER — Emergency Department (HOSPITAL_BASED_OUTPATIENT_CLINIC_OR_DEPARTMENT_OTHER)
Admission: EM | Admit: 2017-10-05 | Discharge: 2017-10-06 | Disposition: A | Discharge status: Home / Self Care | Payer: 59 | Attending: Physician Assistant | Admitting: Physician Assistant

## 2017-10-05 ENCOUNTER — Other Ambulatory Visit: Payer: Self-pay

## 2017-10-05 ENCOUNTER — Emergency Department (HOSPITAL_BASED_OUTPATIENT_CLINIC_OR_DEPARTMENT_OTHER): Payer: 59

## 2017-10-05 ENCOUNTER — Encounter (HOSPITAL_BASED_OUTPATIENT_CLINIC_OR_DEPARTMENT_OTHER): Payer: Self-pay | Admitting: *Deleted

## 2017-10-05 DIAGNOSIS — R1013 Epigastric pain: Secondary | ICD-10-CM

## 2017-10-05 DIAGNOSIS — R197 Diarrhea, unspecified: Secondary | ICD-10-CM | POA: Diagnosis not present

## 2017-10-05 DIAGNOSIS — Z87891 Personal history of nicotine dependence: Secondary | ICD-10-CM | POA: Insufficient documentation

## 2017-10-05 DIAGNOSIS — R112 Nausea with vomiting, unspecified: Secondary | ICD-10-CM | POA: Insufficient documentation

## 2017-10-05 DIAGNOSIS — J45909 Unspecified asthma, uncomplicated: Secondary | ICD-10-CM | POA: Diagnosis not present

## 2017-10-05 LAB — URINALYSIS, ROUTINE W REFLEX MICROSCOPIC
Bilirubin Urine: NEGATIVE
GLUCOSE, UA: NEGATIVE mg/dL
Hgb urine dipstick: NEGATIVE
KETONES UR: NEGATIVE mg/dL
LEUKOCYTES UA: NEGATIVE
Nitrite: NEGATIVE
PH: 5.5 (ref 5.0–8.0)
Protein, ur: NEGATIVE mg/dL
Specific Gravity, Urine: <1.005 — ABNORMAL LOW (ref 1.005–1.030)

## 2017-10-05 LAB — COMPREHENSIVE METABOLIC PANEL
ALBUMIN: 4.3 g/dL (ref 3.5–5.0)
ALT: 32 U/L (ref 17–63)
AST: 24 U/L (ref 15–41)
Alkaline Phosphatase: 76 U/L (ref 38–126)
Anion gap: 8 (ref 5–15)
BUN: 15 mg/dL (ref 6–20)
CHLORIDE: 102 mmol/L (ref 101–111)
CO2: 27 mmol/L (ref 22–32)
Calcium: 9.2 mg/dL (ref 8.9–10.3)
Creatinine, Ser: 0.94 mg/dL (ref 0.61–1.24)
GFR calc Af Amer: >60 mL/min (ref >60)
GFR calc non Af Amer: >60 mL/min (ref >60)
GLUCOSE: 116 mg/dL — AB (ref 65–99)
POTASSIUM: 3.4 mmol/L — AB (ref 3.5–5.1)
SODIUM: 137 mmol/L (ref 135–145)
Total Bilirubin: 1 mg/dL (ref 0.3–1.2)
Total Protein: 7.3 g/dL (ref 6.5–8.1)

## 2017-10-05 LAB — CBC
HEMATOCRIT: 47.2 % (ref 39.0–52.0)
Hemoglobin: 17 g/dL (ref 13.0–17.0)
MCH: 31 pg (ref 26.0–34.0)
MCHC: 36 g/dL (ref 30.0–36.0)
MCV: 86.1 fL (ref 78.0–100.0)
Platelets: 179 10*3/uL (ref 150–400)
RBC: 5.48 MIL/uL (ref 4.22–5.81)
RDW: 11.3 % — AB (ref 11.5–15.5)
WBC: 8.4 10*3/uL (ref 4.0–10.5)

## 2017-10-05 LAB — LIPASE, BLOOD: LIPASE: 31 U/L (ref 11–51)

## 2017-10-05 MED ORDER — FAMOTIDINE 20 MG PO TABS
20.0000 mg | ORAL_TABLET | Freq: Once | ORAL | Status: AC
  Administered 2017-10-05: 20 mg via ORAL
  Filled 2017-10-05: qty 1

## 2017-10-05 MED ORDER — FAMOTIDINE IN NACL 20-0.9 MG/50ML-% IV SOLN
20.0000 mg | Freq: Once | INTRAVENOUS | Status: AC
  Administered 2017-10-05: 20 mg via INTRAVENOUS
  Filled 2017-10-05: qty 50

## 2017-10-05 MED ORDER — DIPHENHYDRAMINE HCL 50 MG/ML IJ SOLN
25.0000 mg | Freq: Once | INTRAMUSCULAR | Status: AC
  Administered 2017-10-05: 25 mg via INTRAVENOUS
  Filled 2017-10-05: qty 1

## 2017-10-05 MED ORDER — IOPAMIDOL (ISOVUE-300) INJECTION 61%
100.0000 mL | Freq: Once | INTRAVENOUS | Status: AC | PRN
  Administered 2017-10-05: 100 mL via INTRAVENOUS

## 2017-10-05 MED ORDER — METHYLPREDNISOLONE SODIUM SUCC 125 MG IJ SOLR
125.0000 mg | Freq: Once | INTRAMUSCULAR | Status: AC
  Administered 2017-10-05: 125 mg via INTRAVENOUS
  Filled 2017-10-05: qty 2

## 2017-10-05 MED ORDER — EPINEPHRINE 0.3 MG/0.3ML IJ SOAJ
0.3000 mg | Freq: Once | INTRAMUSCULAR | Status: AC
  Administered 2017-10-05: 0.3 mg via INTRAMUSCULAR
  Filled 2017-10-05: qty 0.3

## 2017-10-05 MED ORDER — SODIUM CHLORIDE 0.9 % IV BOLUS (SEPSIS)
1000.0000 mL | Freq: Once | INTRAVENOUS | Status: AC
  Administered 2017-10-05: 1000 mL via INTRAVENOUS

## 2017-10-05 MED ORDER — ONDANSETRON HCL 4 MG/2ML IJ SOLN
4.0000 mg | Freq: Once | INTRAMUSCULAR | Status: AC
  Administered 2017-10-05: 4 mg via INTRAVENOUS
  Filled 2017-10-05: qty 2

## 2017-10-05 MED ORDER — SODIUM CHLORIDE 0.9 % IV SOLN
Freq: Once | INTRAVENOUS | Status: AC
  Administered 2017-10-05: 20:00:00 via INTRAVENOUS

## 2017-10-05 NOTE — ED Notes (Signed)
Patient was given sprite to sip on.

## 2017-10-05 NOTE — ED Provider Notes (Signed)
[facility] EMERGENCY DEPARTMENT Provider Note   CSN: [id number] Arrival date & time: 10/05/17  1726     History   Chief Complaint Chief Complaint  Patient presents with  . Abdominal Pain    HPI John [place]line is a 21 y.o. male.  HPI   [place]line is a 21 y.o. male, with a history of meningitis in childhood, presenting to the ED with sudden onset of abdominal pain around 4AM this morning, followed shortly thereafter by nausea, vomiting, and diarrhea.  Endorses 2 episodes of emesis and about 3 episodes of watery diarrhea.  Abdominal pain is now epigastric and periumbilical, intermittent, cramping, 6-8/10, nonradiating.  Does not seem to be affected by eating.  Patient notes a previous history of abdominal discomfort and overall "not feeling well," with ingestion of alcohol.  States this reaction has intensified over the past several months.  He characterizes his alcohol intake as occasional.  Last alcohol intake was last night.  Denies fever/chills, rash, neck pain/stiffness, cough, shortness of breath, chest pain, or any other complaints.   Past Medical History:  Diagnosis Date  . ADHD (attention deficit hyperactivity disorder)    dx'd approx 8th grade.  [facility] [place]  Marland Kitchen History of meningitis age 14   viral  . Mild intermittent asthma    childhood; quiescent for the most part as adult    Patient Active Problem List   Diagnosis Date Noted  . Anxiety disorder 12/11/2015  . Depression 12/11/2015    History reviewed. No pertinent surgical history.     Home Medications    Prior to Admission medications   Medication Sig Start Date End Date Taking? Authorizing Provider  amphetamine-dextroamphetamine (ADDERALL XR) 25 MG 24 hr capsule Take 1 capsule by mouth every evening. 3 month supply 07/13/17   Lorina Rabon, NP  amphetamine-dextroamphetamine (ADDERALL XR) 30 MG 24 hr capsule Take 1 capsule (30 mg total)  by mouth every morning. 3 month supply 07/13/17   Dedlow, Ether Griffins, NP  EPINEPHrine (EPIPEN 2-PAK) 0.3 mg/0.3 mL IJ SOAJ injection Inject 0.3 mLs (0.3 mg total) into the muscle once for 1 dose. 10/06/17 10/06/17  Joy, Shawn C, PA-C  famotidine (PEPCID) 20 MG tablet Take 1 tablet (20 mg total) by mouth 2 (two) times daily for 3 days. 10/05/17 10/08/17  Joy, Shawn C, PA-C  predniSONE (STERAPRED UNI-PAK 21 TAB) 10 MG (21) TBPK tablet Take 6 tabs (60mg ) on day 1, 5 tabs (50mg ) on day 2, 4 tabs (40mg ) on day 3, 3 tabs (30mg ) on day 4, 2 tabs (20mg ) on day 5, and 1 tab (10mg ) on day 6. 10/05/17   Joy, Shawn C, PA-C  promethazine (PHENERGAN) 25 MG tablet Take 1 tablet (25 mg total) by mouth every 6 (six) hours as needed for nausea or vomiting. 10/06/17   Joy, Hillard Danker, PA-C    Family History Family History  Problem Relation Age of Onset  . Graves' disease Mother   . Uterine cancer Maternal Grandmother     Social History Social History   Tobacco Use  . Smoking status: Former Smoker    Types: Cigarettes  . Smokeless tobacco: Never Used  Substance Use Topics  . Alcohol use: Yes    Alcohol/week: 1.2 oz    Types: 2 Standard drinks or equivalent per week  . Drug use: No     Allergies   Zofran [ondansetron hcl]; Amoxicillin; Penicillins; and Sulfa antibiotics   Review of Systems Review  of Systems  Constitutional: Negative for chills and fever.  Respiratory: Negative for cough and shortness of breath.   Cardiovascular: Negative for chest pain.  Gastrointestinal: Positive for abdominal pain, diarrhea, nausea and vomiting.  Genitourinary: Negative for dysuria and hematuria.  Musculoskeletal: Negative for neck pain and neck stiffness.  Skin: Negative for rash.  All other systems reviewed and are negative.    Physical Exam Updated Vital Signs BP 109/72 (BP Location: Right Arm)   Pulse 94   Temp 98.2 F (36.8 C) (Oral)   Resp 18   Ht 5' 9.75" (1.772 m)   Wt 78.5 kg (173 lb)   SpO2 100%   BMI  25.00 kg/m   Physical Exam  Constitutional: He appears well-developed and well-nourished. No distress.  HENT:  Head: Normocephalic and atraumatic.  Eyes: Conjunctivae are normal.  Neck: Neck supple.  Cardiovascular: Normal rate, regular rhythm, normal heart sounds and intact distal pulses.  Pulmonary/Chest: Effort normal and breath sounds normal. No respiratory distress.  Abdominal: Soft. There is tenderness. There is no guarding.    Musculoskeletal: He exhibits no edema.  Lymphadenopathy:    He has no cervical adenopathy.  Neurological: He is alert.  Skin: Skin is warm and dry. He is not diaphoretic.  Psychiatric: He has a normal mood and affect. His behavior is normal.  Nursing note and vitals reviewed.    ED Treatments / Results  Labs (all labs ordered are listed, but only abnormal results are displayed) Labs Reviewed  COMPREHENSIVE METABOLIC PANEL - Abnormal; Notable for the following components:      Result Value   Potassium 3.4 (*)    Glucose, Bld 116 (*)    All other components within normal limits  CBC - Abnormal; Notable for the following components:   RDW 11.3 (*)    All other components within normal limits  URINALYSIS, ROUTINE W REFLEX MICROSCOPIC - Abnormal; Notable for the following components:   Specific Gravity, Urine <1.005 (*)    All other components within normal limits  LIPASE, BLOOD    EKG  EKG Interpretation None       Radiology Ct Abdomen Pelvis W Contrast  Result Date: 10/05/2017 CLINICAL DATA:  Abdominal pain since 4 a.m.  Diarrhea. EXAM: CT ABDOMEN AND PELVIS WITH CONTRAST TECHNIQUE: Multidetector CT imaging of the abdomen and pelvis was performed using the standard protocol following bolus administration of intravenous contrast. CONTRAST:  ISOVUE-300 IOPAMIDOL (ISOVUE-300) INJECTION 61% COMPARISON:  None. FINDINGS: Lower chest: The lung bases are clear without focal nodule, mass, or airspace disease. Heart size is normal. No  significant pleural or pericardial effusion is present. Hepatobiliary: No focal liver abnormality is seen. No gallstones, gallbladder wall thickening, or biliary dilatation. Pancreas: Unremarkable. No pancreatic ductal dilatation or surrounding inflammatory changes. Spleen: Normal in size without focal abnormality. Adrenals/Urinary Tract: The adrenal glands are normal bilaterally. Kidneys and ureters are within normal limits. The urinary bladder is unremarkable. Stomach/Bowel: Stomach is mildly distended. There is no mechanical obstruction. Duodenum is within normal limits. The small bowel is unremarkable. Terminal ileum is within normal limits. The appendix is visualized and normal. The ascending and transverse colon are within normal limits. The descending and sigmoid colon are normal. Vascular/Lymphatic: No significant vascular findings are present. No enlarged abdominal or pelvic lymph nodes. Reproductive: Prostate is unremarkable. Other: No abdominal wall hernia or abnormality. No abdominopelvic ascites. Musculoskeletal: Vertebral body heights and alignment are normal. The bony pelvis is intact. The hips are located and within normal limits  bilaterally. IMPRESSION: 1. Mild distension of the stomach without mechanical obstruction. This could be contributing to patient's pain. 2. No other acute or focal lesion to explain the patient's symptoms. The abdomen is otherwise normal. Electronically Signed   By: Marin Robertshristopher  Mattern M.D.   On: 10/05/2017 21:55    Procedures .Critical Care Performed by: Anselm PancoastJoy, Shawn C, PA-C Authorized by: Anselm PancoastJoy, Shawn C, PA-C   Critical care provider statement:    Critical care time (minutes):  35   Critical care time was exclusive of:  Separately billable procedures and treating other patients   Critical care was necessary to treat or prevent imminent or life-threatening deterioration of the following conditions:  Shock   Critical care was time spent personally by me on the  following activities:  Examination of patient, evaluation of patient's response to treatment, re-evaluation of patient's condition, pulse oximetry, ordering and review of radiographic studies, ordering and review of laboratory studies, ordering and performing treatments and interventions, obtaining history from patient or surrogate and development of treatment plan with patient or surrogate   I assumed direction of critical care for this patient from another provider in my specialty: no     (including critical care time)  Medications Ordered in ED Medications  ondansetron (ZOFRAN) injection 4 mg (4 mg Intravenous Given 10/05/17 1750)  famotidine (PEPCID) tablet 20 mg (20 mg Oral Given 10/05/17 1839)  methylPREDNISolone sodium succinate (SOLU-MEDROL) 125 mg/2 mL injection 125 mg (125 mg Intravenous Given 10/05/17 1839)  diphenhydrAMINE (BENADRYL) injection 25 mg (25 mg Intravenous Given 10/05/17 1839)  diphenhydrAMINE (BENADRYL) injection 25 mg (25 mg Intravenous Given 10/05/17 1957)  famotidine (PEPCID) IVPB 20 mg premix (0 mg Intravenous Stopped 10/05/17 2036)  EPINEPHrine (EPI-PEN) injection 0.3 mg (0.3 mg Intramuscular Given 10/05/17 2006)  0.9 %  sodium chloride infusion ( Intravenous Stopped 10/05/17 2036)  sodium chloride 0.9 % bolus 1,000 mL (0 mLs Intravenous Stopped 10/05/17 2325)  iopamidol (ISOVUE-300) 61 % injection 100 mL (100 mLs Intravenous Contrast Given 10/05/17 2124)     Initial Impression / Assessment and Plan / ED Course  I have reviewed the triage vital signs and the nursing notes.  Pertinent labs & imaging results that were available during my care of the patient were reviewed by me and considered in my medical decision making (see chart for details).  Clinical Course as of Oct 07 127  Mon Oct 05, 2017  1830 Was asked to see patient in triage by RN. RN gave Zofran, 15 min later patient began to have a pruritic rash on his arms, chest, and back that was not present  prior to administration of Zofran. Patient denies shortness of breath, chest pain, vomiting, throat or intraoral swelling, or any other complaints.   [SJ]  2012 Patient looks much better following Epi. States he feels much better. Hives and red skin tone appears to be resolving. Blood pressure has improved.   [SJ]  2248 Discussed imaging results with the patient. He states his symptoms have completely resolved. Patient noted to have no abdominal tenderness.  Skin tone is normal.  [SJ]    Clinical Course User Index [SJ] Joy, Shawn C, PA-C    The patient presented with complaint of abdominal pain, nausea, vomiting, and diarrhea.  During initial triage assessment, patient was given Zofran.  Patient then began to have symptoms of allergic reaction.  Patient eventually required epinephrine IM, after which patient showed significant improvement.  Patient was observed with no noted rebound of symptoms.  Patient  able to tolerate PO.  Repeat abdominal exams benign.  Follow-up with PCP, GI, and allergist. The patient was given instructions for home care as well as return precautions. Patient voices understanding of these instructions, accepts the plan, and is comfortable with discharge.   Findings and plan of care discussed with Bary Castillaourteney Mackuen, MD. Dr. Corlis LeakMackuen personally evaluated and examined this patient.   Vitals:   10/05/17 2006 10/05/17 2010 10/05/17 2015 10/05/17 2030  BP: (!) 58/37 112/74 (!) 125/48 (!) 143/63  Pulse: 100  74 94  Resp: 19 19 20 17   Temp:      TempSrc:      SpO2: (!) 86%  98% 96%  Weight:      Height:      Patient's drop in blood pressure occurred right after patient had a bowel movement and then tried to stand up.  It may have been a vasovagal reaction, however, in light of the patient's hives and other signs of allergic reaction, he was treated as if it was an anaphylactic reaction.   Vitals:   10/05/17 2015 10/05/17 2030 10/05/17 2100 10/05/17 2239  BP: (!) 125/48  (!) 143/63 121/74 107/68  Pulse: 74 94 89 89  Resp: 20 17 19 19   Temp:      TempSrc:      SpO2: 98% 96% 99% 98%  Weight:      Height:         Final Clinical Impressions(s) / ED Diagnoses   Final diagnoses:  Epigastric pain    ED Discharge Orders        Ordered    EPINEPHrine (EPIPEN 2-PAK) 0.3 mg/0.3 mL IJ SOAJ injection   Once     10/06/17 0034    promethazine (PHENERGAN) 25 MG tablet  Every 6 hours PRN     10/06/17 0040    famotidine (PEPCID) 20 MG tablet  2 times daily     10/06/17 0034    predniSONE (STERAPRED UNI-PAK 21 TAB) 10 MG (21) TBPK tablet     10/06/17 0034       Anselm PancoastJoy, Shawn C, PA-C 10/06/17 0139    Anselm PancoastJoy, Shawn C, PA-C 10/06/17 0140    Abelino DerrickMackuen, Courteney Lyn, MD 10/06/17 2355

## 2017-10-05 NOTE — ED Notes (Signed)
Pt presents with new rash while in waiting room. RN informed. PA in triage now.

## 2017-10-05 NOTE — ED Triage Notes (Signed)
Abdominal pain since 4 am. Vomited x 1 this am. Gas like pain. Diarrhea x 3 today.

## 2017-10-05 NOTE — ED Notes (Signed)
Pt still red all over. Pt complains of itching.

## 2017-10-05 NOTE — Discharge Instructions (Addendum)
You seem to have had an allergic reaction, which may have been from Zofran (ondansetron).  Do not take this medication again.  You may use the phenergan, as needed, for nausea/vomiting.    Allergic Reaction Instructions:  Benadryl: Take 25 mg of Benadryl every 6 hours for the next 24 hours.  Use caution as Benadryl can make you drowsy. Pepcid: Take the Pepcid, as prescribed, over the next 3 days. Prednisone: Take prednisone, as prescribed, until finished. Epinephrine: This medication is to be used as needed for a severe allergic reaction called anaphylaxis. If you must use this medication, you should then call 911 or proceed to the nearest emergency room for continued evaluation and treatment.  Follow-up with your primary care provider on this matter.  Allergy testing with an allergist may be warranted.  Return to the ED for worsening symptoms, shortness of breath, chest pain, palpitations, persistent vomiting, facial or throat swelling, or any other major concerns.

## 2017-10-06 MED ORDER — FAMOTIDINE 20 MG PO TABS: 20.0000 mg | ORAL_TABLET | Freq: Two times a day (BID) | ORAL | 0 refills | Status: DC

## 2017-10-06 MED ORDER — PROMETHAZINE HCL 25 MG PO TABS: 25.0000 mg | ORAL_TABLET | Freq: Four times a day (QID) | ORAL | 0 refills | Status: DC | PRN

## 2017-10-06 MED ORDER — EPINEPHRINE 0.3 MG/0.3ML IJ SOAJ: 0.3000 mg | Freq: Once | INTRAMUSCULAR | 1 refills | Status: AC

## 2017-10-06 MED ORDER — PREDNISONE 10 MG (21) PO TBPK: ORAL_TABLET | ORAL | 0 refills | Status: DC

## 2017-10-22 ENCOUNTER — Other Ambulatory Visit: Payer: Self-pay | Admitting: Family

## 2017-10-22 NOTE — Telephone Encounter (Signed)
Patient called for refill for Amphetmine Salts, requesting a 90-day supply.  Patient last seen 02/02/17, next appointment 12/07/17.

## 2017-10-23 MED ORDER — AMPHETAMINE-DEXTROAMPHET ER 30 MG PO CP24: 30.0000 mg | ORAL_CAPSULE | Freq: Every morning | ORAL | 0 refills | Status: DC

## 2017-10-23 MED ORDER — AMPHETAMINE-DEXTROAMPHET ER 25 MG PO CP24: 25.0000 mg | ORAL_CAPSULE | Freq: Every evening | ORAL | 0 refills | Status: DC

## 2017-10-23 NOTE — Telephone Encounter (Signed)
Printed Rx and placed at front desk for pick-up-Adderall XR 30 mg and 25 mg each # 90 (3 month supply). Has appointment on 12/07/17.

## 2017-12-07 ENCOUNTER — Ambulatory Visit: Payer: 59 | Admitting: Family

## 2017-12-07 ENCOUNTER — Encounter: Payer: Self-pay | Admitting: Family

## 2017-12-07 VITALS — BP 118/78 | HR 76 | Resp 16 | Ht 69.75 in | Wt 178.2 lb

## 2017-12-07 DIAGNOSIS — F419 Anxiety disorder, unspecified: Secondary | ICD-10-CM | POA: Diagnosis not present

## 2017-12-07 DIAGNOSIS — F902 Attention-deficit hyperactivity disorder, combined type: Secondary | ICD-10-CM

## 2017-12-07 DIAGNOSIS — Z79899 Other long term (current) drug therapy: Secondary | ICD-10-CM | POA: Diagnosis not present

## 2017-12-07 DIAGNOSIS — Z719 Counseling, unspecified: Secondary | ICD-10-CM | POA: Diagnosis not present

## 2017-12-07 MED ORDER — AMPHETAMINE-DEXTROAMPHET ER 30 MG PO CP24: 30.0000 mg | ORAL_CAPSULE | Freq: Every morning | ORAL | 0 refills | Status: DC

## 2017-12-07 MED ORDER — AMPHETAMINE-DEXTROAMPHET ER 25 MG PO CP24: 25.0000 mg | ORAL_CAPSULE | Freq: Every evening | ORAL | 0 refills | Status: DC

## 2017-12-07 NOTE — Progress Notes (Signed)
[facility] [address] [place] [address] Dept: [phone] Dept Fax: [phone] Loc: [phone] Loc Fax: [phone]  Medical Follow-up  Patient ID: John Kline, male  DOB: 12/14/1995, 22 y.o.  MRN: [id number]  Date of Evaluation: 12/07/2017  PCP: John MassedMcGowen, Philip H, MD  Accompanied by: self Patient Lives with: mother  HISTORY/CURRENT STATUS:  HPI  Patient here for routine follow up related to ADHD, Anxiety, and medication management. Patient here by himself for today's visit. Doing well at work and liking his job. Patient bought new house and getting married in May. Patient doing well with Adderall XR 30 mg and 25 mg daily with no side effects reported.   EDUCATION/WORK: Work: [employer]uary[employer] Hours: 80+ hours/week Status: Full-time Activities/Exercise: intermittently  MEDICAL HISTORY: Appetite: Good MVI/Other: None Fruits/Vegs:Good Calcium: Variety Iron:Variety  Sleep: About 5-6 hours/night-working late hours.  Sleep Concerns: Initiation/Maintenance/Other: None reported by patient.   Individual Medical History/Review of System Changes? Yes, had mono in January.  Recent viral illness and non-productive cough.  Allergies: Zofran [ondansetron hcl]; Amoxicillin; Penicillins; and Sulfa antibiotics  Current Medications:  Current Outpatient Medications:  .  amphetamine-dextroamphetamine (ADDERALL XR) 25 MG 24 hr capsule, Take 1 capsule by mouth every evening., Disp: 30 capsule, Rfl: 0 .  amphetamine-dextroamphetamine (ADDERALL XR) 30 MG 24 hr capsule, Take 1 capsule (30 mg total) by mouth every morning., Disp: 30 capsule, Rfl: 0 Medication Side Effects: None  Family Medical/Social History Changes?: Yes, getting married in May and recently bough a house.  MENTAL HEALTH: Mental Health Issues: Anxiety-decreased  recently  PHYSICAL EXAM: Vitals:  Today's Vitals   12/07/17 0816  BP: 118/78  Pulse: 76  Resp: 16  Weight: 178 lb 3.2 oz (80.8 kg)  Height: 5' 9.75" (1.772 m)  PainSc: 0-No pain  , Facility age limit for growth percentiles is 20 years.  General Exam: Physical Exam  Constitutional: He is oriented to person, place, and time. He appears well-developed and well-nourished.  HENT:  Head: Normocephalic and atraumatic.  Right Ear: External ear normal.  Left Ear: External ear normal.  Nose: Nose normal.  Mouth/Throat: Oropharynx is clear and moist.  Eyes: Conjunctivae and EOM are normal. Pupils are equal, round, and reactive to light.  Neck: Trachea normal, normal range of motion and full passive range of motion without pain. Neck supple.  Cardiovascular: Normal rate, regular rhythm, normal heart sounds and intact distal pulses.  Pulmonary/Chest: Effort normal and breath sounds normal.  Abdominal: Soft. Bowel sounds are normal.  Genitourinary:  Genitourinary Comments: Deferred  Musculoskeletal: Normal range of motion.  Neurological: He is alert and oriented to person, place, and time. He has normal reflexes.  Skin: Skin is warm, dry and intact. Capillary refill takes less than 2 seconds.  Psychiatric: He has a normal mood and affect. His behavior is normal. Judgment and thought content normal.  Vitals reviewed.  Review of Systems  Psychiatric/Behavioral: Positive for decreased concentration.  All other systems reviewed and are negative.  Patient with no concerns for toileting. Daily stool, no constipation or diarrhea. Void urine no difficulty. No enuresis.   Participate in daily oral hygiene to include brushing and flossing.  Neurological: oriented to time, place, and person Cranial Nerves: normal  Neuromuscular:  Motor Mass: Normal  Tone: Normal  Strength: Normal  DTRs: 2+ and symmetric Overflow: None Reflexes: no tremors noted Sensory Exam: Vibratory: Intact  Fine Touch:  Intact  Testing/Developmental Screens:  ASRS-15/21 scored by patient and counseled at today' s visit.   DIAGNOSES:    ICD-10-CM   1. Attention deficit hyperactivity disorder (ADHD), combined type F90.2   2. Anxiety disorder, unspecified type F41.9   3. Medication management Z79.899   4. Patient counseled Z71.9     RECOMMENDATIONS: 3 month follow up and continuation of medication. Counseled on medication management with Adderall XR 30 mg and 25 mg each daily, # 30 [facility].  Reviewed old records and/or current chart since last follow up visit at [facility].   Discussed recent history and today's examination with no changes and unremarkable exam today.   Counseled regarding anticipatory guidance with current work schedule and change with living in a new house along with getting married.   Recommended a high protein, low sugar and preservatives diet for ADHD patients. Suggested good variety of healthy foods with smaller meals more frequently during the day.    Counseled on the need to increase exercise and make healthy eating choices during the day. To continue with exercise when able to and continue with healthy lifestyle choices.   Discussed work progress and advocated for appropriate accommodations as needed.   Advised on medication options, administration, effects, and possible side effects with Adderall XR daily.  Instructed on the importance of good sleep hygiene, a routine bedtime, no TV in bedroom along with decreased screen exposure before bed when possible.   Directed to f/u with PCP yearly, dentist every 6 months, MVI daily, regular exercise to assist with stressors from work and health eating habits.   NEXT APPOINTMENT: Return in about 3 months (around 03/09/2018) for follow up visit.  More than 50% of the appointment was spent counseling and discussing diagnosis and management of symptoms with the patient and family.  Carron Curie, NP Counseling Time: 30  mins Total Contact Time: 40 mins

## 2018-01-14 ENCOUNTER — Other Ambulatory Visit: Payer: Self-pay

## 2018-01-14 MED ORDER — AMPHETAMINE-DEXTROAMPHET ER 25 MG PO CP24: 25.0000 mg | ORAL_CAPSULE | Freq: Every evening | ORAL | 0 refills | Status: DC

## 2018-01-14 MED ORDER — AMPHETAMINE-DEXTROAMPHET ER 30 MG PO CP24: 30.0000 mg | ORAL_CAPSULE | Freq: Every morning | ORAL | 0 refills | Status: DC

## 2018-01-14 NOTE — Telephone Encounter (Signed)
Patient called in stating he needs refills for his Adderall XR but would like to go back to receiving the 90 day supply of his meds instead of the 30 day supply. Last visit 12/07/2017 next visit 03/22/2018. [place] [facility] [place]

## 2018-01-14 NOTE — Addendum Note (Signed)
Addended by: CRUMP, BOBI A on: 01/14/2018 03:23 PM   Modules accepted: Orders

## 2018-01-14 NOTE — Telephone Encounter (Signed)
RX for above e-scribed and sent to pharmacy on record  [id number] - [place], [place] - [address] [facility] [address] [place] [id number] Phone: [phone] Fax: [phone]

## 2018-01-14 NOTE — Telephone Encounter (Signed)
Received call back requesting difference pharmacy. RX for above e-scribed and sent to pharmacy on record  [id number] [place], [place] - [address] [place] [id number] Phone: [phone] Fax: [phone]

## 2018-03-22 ENCOUNTER — Institutional Professional Consult (permissible substitution): Payer: 59 | Admitting: Family

## 2018-04-20 ENCOUNTER — Telehealth: Payer: Self-pay | Admitting: Family

## 2018-04-20 DIAGNOSIS — Z87892 Personal history of anaphylaxis: Secondary | ICD-10-CM | POA: Diagnosis not present

## 2018-04-20 NOTE — Telephone Encounter (Signed)
°  [facility] tl

## 2018-07-22 DIAGNOSIS — Z79899 Other long term (current) drug therapy: Secondary | ICD-10-CM | POA: Diagnosis not present

## 2018-10-25 DIAGNOSIS — Z6826 Body mass index (BMI) 26.0-26.9, adult: Secondary | ICD-10-CM | POA: Diagnosis not present

## 2018-10-27 DIAGNOSIS — Z713 Dietary counseling and surveillance: Secondary | ICD-10-CM | POA: Diagnosis not present

## 2018-10-27 DIAGNOSIS — Z136 Encounter for screening for cardiovascular disorders: Secondary | ICD-10-CM | POA: Diagnosis not present

## 2019-01-06 DIAGNOSIS — J302 Other seasonal allergic rhinitis: Secondary | ICD-10-CM | POA: Diagnosis not present

## 2019-01-24 DIAGNOSIS — Z6826 Body mass index (BMI) 26.0-26.9, adult: Secondary | ICD-10-CM | POA: Diagnosis not present

## 2019-03-16 ENCOUNTER — Ambulatory Visit (INDEPENDENT_AMBULATORY_CARE_PROVIDER_SITE_OTHER): Payer: 59

## 2019-03-16 ENCOUNTER — Ambulatory Visit (HOSPITAL_COMMUNITY)
Admission: EM | Admit: 2019-03-16 | Discharge: 2019-03-16 | Disposition: A | Discharge status: Home / Self Care | Payer: 59 | Attending: Family Medicine | Admitting: Family Medicine

## 2019-03-16 ENCOUNTER — Other Ambulatory Visit: Payer: Self-pay

## 2019-03-16 ENCOUNTER — Encounter (HOSPITAL_COMMUNITY): Payer: Self-pay | Admitting: Emergency Medicine

## 2019-03-16 DIAGNOSIS — R0602 Shortness of breath: Secondary | ICD-10-CM

## 2019-03-16 MED ORDER — HYDROXYZINE HCL 25 MG PO TABS: 25.0000 mg | ORAL_TABLET | Freq: Three times a day (TID) | ORAL | 0 refills | Status: DC | PRN

## 2019-03-16 MED ORDER — ALBUTEROL SULFATE HFA 108 (90 BASE) MCG/ACT IN AERS: 1.0000 | INHALATION_SPRAY | Freq: Four times a day (QID) | RESPIRATORY_TRACT | 0 refills | Status: DC | PRN

## 2019-03-16 NOTE — Discharge Instructions (Addendum)
Your chest xray looks well today, no indications of source of your shortness of breath.  I have sent an inhaler as needed. Be mindful however that this medication can further increase your heart rate.  I have also sent for a medication which can help with anxiety. May cause drowsiness. Please do not take if driving or drinking alcohol.   Please continue to follow with your primary care provider if symptoms persist.  If develop any worsening of symptoms, loss of consciousness, persistent chest pain , fevers, or otherwise worsening please go to the ER.      EXAM: CHEST - 2 VIEW   COMPARISON:  November 24, 2006   FINDINGS: The heart size and mediastinal contours are within normal limits. Both lungs are clear. The visualized skeletal structures are unremarkable.   IMPRESSION: No active cardiopulmonary disease.     Electronically Signed   By: Dorise Bullion III M.D   On: 03/16/2019 18:51

## 2019-03-16 NOTE — ED Triage Notes (Signed)
Pt presents to [facility] for assessment of worsening shortness of breath x 1.5 weeks.  Patient states that he began to have shortness of breath, fatigue, and one evening of fevers (99.9) 1.5 weeks ago.  All symptoms resolved, except the shortness of breath.  Pt is currently out of work for quarantine due to symptoms, has had a negative COVID test.  Patient states he is also in a lot of pain from his wisdom teeth being impacted and needing to come out.  Patient states he is concerned that he may be dealing with some anxiety over the pain, but wanted to come in to be cleared.

## 2019-03-16 NOTE — ED Provider Notes (Signed)
[facility]    CSN: [id number] Arrival date & time: 03/16/19  1730     History   Chief Complaint Chief Complaint  Patient presents with  . Shortness of Breath    HPI John Kline is a 23 y.o. male.   John Kline presents with complaints of shortness of breath . 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 Has had intermittent episodes. Tested for covid which was negative. Has tried to decrease stress, which has helped. But couldn't catch breath today after phone call. Feels like throat is tight. No position seems to help him get a full breath. Since here has stayed the same. Phone call was not necessarily stressful. Speaking a lot increases his symptoms. 1 week ago this started. Sunday, temp of 99.9. never since. Body aches that day as well. covid testing on Sunday. No cough. In the past few hours, when trying to force a cough, feels like no air coming out. No mucus congestion. No ear pain no sore throat. Feels light headed. No tingling to face or hands. Has impacted wisdom teeth, appointment tomorrow with dentist. So has been taking ibuprofen, helps with dental pain. No chest pain. Does have intermittent chest pain for the past week. No known covid exposures. Normal appetite. Poor sleep, back feels sore, feels more fatigued. No leg pain or swelling. No travel. No hormone therapy. No history of blood clots.  Has been under increased stress, with work, and covid. 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 History of asthma. Does not have an inhaler and hasn't had to use one in years. Hx of adhd and takes adderall.    ROS per HPI, negative if not otherwise mentioned.      Past Medical History:  Diagnosis Date  . ADHD (attention deficit hyperactivity disorder)    dx'd approx 8th grade.  [facility] [place]  Marland Kitchen. History of  meningitis age 23   viral  . Mild intermittent asthma    childhood; quiescent for the most part as adult    Patient Active Problem List   Diagnosis Date Noted  . Anxiety disorder 12/11/2015    History reviewed. No pertinent surgical history.     Home Medications    Prior to Admission medications   Medication Sig Start Date End Date Taking? Authorizing Provider  ibuprofen (ADVIL) 400 MG tablet Take 400 mg by mouth every 6 (six) hours as needed.   Yes [provider]  albuterol (VENTOLIN HFA) 108 (90 Base) MCG/ACT inhaler Inhale 1-2 puffs into the lungs every 6 (six) hours as needed for wheezing or shortness of breath. 03/16/19   Georgetta HaberBurky, Natalie B, NP  amphetamine-dextroamphetamine (ADDERALL XR) 25 MG 24 hr capsule Take 1 capsule by mouth every evening. 01/14/18   Crump, Bobi A, NP  amphetamine-dextroamphetamine (ADDERALL XR) 30 MG 24 hr capsule Take 1 capsule (30 mg total) by mouth every morning. 01/14/18   Crump, Priscille LovelessBobi A, NP  hydrOXYzine (ATARAX/VISTARIL) 25 MG tablet Take 1 tablet (25 mg total) by mouth every 8 (eight) hours as needed for anxiety (start at bedtime). 03/16/19   Georgetta HaberBurky, Natalie B, NP    Family History Family History  Problem Relation Age of Onset  . Graves' disease Mother   . Uterine cancer Maternal Grandmother     Social History Social History  Tobacco Use  . Smoking status: Former Smoker    Types: Cigarettes  . Smokeless tobacco: Never Used  Substance Use Topics  . Alcohol use: Yes    Alcohol/week: 2.0 standard drinks    Types: 2 Standard drinks or equivalent per week  . Drug use: No     Allergies   Zofran [ondansetron hcl]; Amoxicillin; Penicillins; and Sulfa antibiotics   Review of Systems Review of Systems   Physical Exam Triage Vital Signs ED Triage Vitals  Enc Vitals Group     BP 03/16/19 1747 (!) 135/94     Pulse Rate 03/16/19 1747 77     Resp 03/16/19 1747 20     Temp 03/16/19 1747 98 F (36.7 C)     Temp Source 03/16/19  1747 Oral     SpO2 03/16/19 1747 100 %     Weight --      Height --      Head Circumference --      Peak Flow --      Pain Score 03/16/19 1748 0     Pain Loc --      Pain Edu? --      Excl. in GC? --    No data found.  Updated Vital Signs BP (!) 135/94 (BP Location: Left Arm)   Pulse 77   Temp 98 F (36.7 C) (Oral)   Resp 20   SpO2 100%   Physical Exam Constitutional:      Appearance: He is well-developed.  Cardiovascular:     Rate and Rhythm: Normal rate and regular rhythm.  Pulmonary:     Effort: Pulmonary effort is normal.     Breath sounds: Normal breath sounds. No decreased breath sounds or wheezing.  Skin:    General: Skin is warm and dry.  Neurological:     General: No focal deficit present.     Mental Status: He is alert and oriented to person, place, and time.      UC Treatments / Results  Labs (all labs ordered are listed, but only abnormal results are displayed) Labs Reviewed - No data to display  EKG None  Radiology Dg Chest 2 View  Result Date: 03/16/2019 CLINICAL DATA:  Difficulty taking deep breath. EXAM: CHEST - 2 VIEW COMPARISON:  November 24, 2006 FINDINGS: The heart size and mediastinal contours are within normal limits. Both lungs are clear. The visualized skeletal structures are unremarkable. IMPRESSION: No active cardiopulmonary disease. Electronically Signed   By: Gerome Samavid  Williams III M.D   On: 03/16/2019 18:51    Procedures Procedures (including critical care time)  Medications Ordered in UC Medications - No data to display  Initial Impression / Assessment and Plan / UC Course  I have reviewed the triage vital signs and the nursing notes.  Pertinent labs & imaging results that were available during my care of the patient were reviewed by me and considered in my medical decision making (see chart for details).     Patient with no apparent shortness of breath during physical exam. Speech is normal. No cough. No fevers. Has had  negative covid testing. Endorses significant stress, discussed this at length. Will provide inhaler for prn use, discussed heart rate effects with this. Suspect anxiety to be contributing to symptoms, will try hydroxyzine at night and prn. Encouraged follow up with PCP and/or counseling as needed. Return precautions provided. Patient verbalized understanding and agreeable to plan.   Final Clinical Impressions(s) / UC Diagnoses   Final diagnoses:  Shortness of breath     Discharge Instructions     Your chest xray looks well today, no indications of source of your shortness of breath.  I have sent an inhaler as needed. Be mindful however that this medication can further increase your heart rate.  I have also sent for a medication which can help with anxiety. May cause drowsiness. Please do not take if driving or drinking alcohol.   Please continue to follow with your primary care provider if symptoms persist.  If develop any worsening of symptoms, loss of consciousness, persistent chest pain , fevers, or otherwise worsening please go to the ER.      EXAM: CHEST - 2 VIEW   COMPARISON:  November 24, 2006   FINDINGS: The heart size and mediastinal contours are within normal limits. Both lungs are clear. The visualized skeletal structures are unremarkable.   IMPRESSION: No active cardiopulmonary disease.     Electronically Signed   By: Dorise Bullion III M.D   On: 03/16/2019 18:51    ED Prescriptions    Medication Sig Dispense Auth. Provider   albuterol (VENTOLIN HFA) 108 (90 Base) MCG/ACT inhaler Inhale 1-2 puffs into the lungs every 6 (six) hours as needed for wheezing or shortness of breath. 1 Inhaler Augusto Gamble B, NP   hydrOXYzine (ATARAX/VISTARIL) 25 MG tablet Take 1 tablet (25 mg total) by mouth every 8 (eight) hours as needed for anxiety (start at bedtime). 12 tablet Zigmund Gottron, NP     Controlled Substance Prescriptions [place] Controlled Substance Registry  consulted? Not Applicable   Zigmund Gottron, NP 03/16/19 [phone]

## 2019-03-16 NOTE — ED Notes (Signed)
Patient able to ambulate independently  

## 2020-11-16 ENCOUNTER — Ambulatory Visit: Payer: 59 | Admitting: Physician Assistant

## 2020-11-20 ENCOUNTER — Encounter: Payer: Self-pay | Admitting: Physician Assistant

## 2020-11-20 DIAGNOSIS — T7840XA Allergy, unspecified, initial encounter: Secondary | ICD-10-CM

## 2020-11-20 HISTORY — DX: Allergy, unspecified, initial encounter: T78.40XA

## 2020-11-26 ENCOUNTER — Telehealth: Payer: Self-pay

## 2020-11-26 ENCOUNTER — Other Ambulatory Visit: Payer: Self-pay

## 2020-11-26 ENCOUNTER — Encounter: Payer: Self-pay | Admitting: Physician Assistant

## 2020-11-26 ENCOUNTER — Ambulatory Visit (INDEPENDENT_AMBULATORY_CARE_PROVIDER_SITE_OTHER): Payer: 59 | Admitting: Physician Assistant

## 2020-11-26 VITALS — BP 130/80 | HR 88 | Temp 98.4°F | Ht 69.5 in | Wt 185.0 lb

## 2020-11-26 DIAGNOSIS — F909 Attention-deficit hyperactivity disorder, unspecified type: Secondary | ICD-10-CM

## 2020-11-26 MED ORDER — AMPHETAMINE-DEXTROAMPHET ER 25 MG PO CP24: 25.0000 mg | ORAL_CAPSULE | Freq: Two times a day (BID) | ORAL | 0 refills | Status: DC

## 2020-11-26 NOTE — Telephone Encounter (Signed)
Went up front and picked up phone to talk to pt. Pt very upset about not getting his Adderall. Before I picked up pt was on mute and just talking frantically saying "I just want to be normal need my medication" picked up phone and spoke to pt told him to calm down , what do you need? Pt said he forgot to tell Lelon Mast Rx for Adderall needs to be Brand name only. Told him okay I can have Samantha resend the Rx's to the pharmacy for you. Asked pt are you sure they need Brand Name? Pt said yes that is what is on his papers. Pt said he needs his medication has not been on full dose due to running low and he can not miss work. 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 Told him I will have Samantha resend Rx's now. Pt verbalized understanding.  Samantha notified about pt and Rx's resent.   Pt sent a My Chart message about Rx and I responded to it. See in chart.

## 2020-11-26 NOTE — Telephone Encounter (Signed)
Pt called in to notify Lelon Mast that the pharmacy stated his Adderall would not be covered by insurance. Pt seemed very distressed about this. He asked frantically, " what do I do? Do I need to come back in? What do I need to do?" I told pt to hold on and let me talk to samantha. I placed pt on mute. While I was waiting for Lelon Mast to respond, I could hear the pt talking to himself. Pt was talking in a very quick manner. Pt was stating " I knew this would happen. I knew I wouldn't be able to get my medicine. It doesn't matter what Dr I go to, Ill never be able to get my medicine. I will never be able to be normal." pt was ranting for about 3 minutes. Lupita Leash came up front and talked to pt

## 2020-11-26 NOTE — Progress Notes (Signed)
John Kline is a 25 y.o. male is here to establish care.  I acted as a Neurosurgeon for [employer], PA-C Corky Mull, LPN   History of Present Illness:   Chief Complaint  Patient presents with  . Establish Care  . ADHD    HPI  Pt is here to establish care today and needing refills on Adderall.  ADHD He is currently taking Adderall XR 25 mg in AM and Adderall XR 30 mg around lunchtime. Documentation in Epic from prior providers confirming this diagnosis. Denies: worsening anxiety with this medication, palpitations, insomnia, chest pain, HTN.  He has been on ADHD medications since high school. He has tried once a day dosing without significant relief of symptoms.   Health Maintenance Due  Topic Date Due  . Hepatitis C Screening  Never done  . TETANUS/TDAP  Never done    Past Medical History:  Diagnosis Date  . ADHD (attention deficit hyperactivity disorder)    dx'd approx 8th grade.  [facility] [place].  Marland Kitchen Allergy 11/20/2020   Pencillin, Amoxicillin, Sulfa Products, and Zofran  . Anxiety disorder    medications included zoloft in high school  . History of meningitis age 45   viral  . Mild intermittent asthma    childhood; quiescent for the most part as adult     Social History   Tobacco Use  . Smoking status: Former Smoker    Packs/day: 0.00    Years: 0.00    Pack years: 0.00    Types: Cigarettes  . Smokeless tobacco: Never Used  Vaping Use  . Vaping Use: Never used  Substance Use Topics  . Alcohol use: Yes    Alcohol/week: 2.0 standard drinks    Types: 2 Standard drinks or equivalent per week  . Drug use: No    History reviewed. No pertinent surgical history.  Family History  Problem Relation Age of Onset  . Graves' disease Mother   . Uterine cancer Maternal Grandmother   . Arthritis Maternal Grandmother   . Cancer Maternal Grandmother   . Early death Maternal Grandmother   . Obesity Maternal  Grandmother   . Alcohol abuse Maternal Grandfather   . Early death Maternal Grandfather     PMHx, SurgHx, SocialHx, FamHx, Medications, and Allergies were reviewed in the Visit Navigator and updated as appropriate.   Patient Active Problem List   Diagnosis Date Noted  . Anxiety disorder 12/11/2015    Social History   Tobacco Use  . Smoking status: Former Smoker    Packs/day: 0.00    Years: 0.00    Pack years: 0.00    Types: Cigarettes  . Smokeless tobacco: Never Used  Vaping Use  . Vaping Use: Never used  Substance Use Topics  . Alcohol use: Yes    Alcohol/week: 2.0 standard drinks    Types: 2 Standard drinks or equivalent per week  . Drug use: No    Current Medications and Allergies:    Current Outpatient Medications:  .  amphetamine-dextroamphetamine (ADDERALL XR) 25 MG 24 hr capsule, Take 1 capsule by mouth in the morning and at bedtime., Disp: 60 capsule, Rfl: 0 .  [START ON 12/26/2020] amphetamine-dextroamphetamine (ADDERALL XR) 25 MG 24 hr capsule, Take 1 capsule by mouth in the morning and at bedtime., Disp: 60 capsule, Rfl: 0 .  [START ON 01/25/2021] amphetamine-dextroamphetamine (ADDERALL XR) 25 MG 24 hr capsule, Take 1 capsule by mouth in the morning and at bedtime., Disp: 60  capsule, Rfl: 0   Allergies  Allergen Reactions  . Zofran [Ondansetron Hcl] Anaphylaxis    Allergic reaction noted at 1833  . Amoxicillin   . Penicillins Rash  . Sulfa Antibiotics Rash    Review of Systems   ROS Negative unless otherwise specified per HPI.  Vitals:   Vitals:   11/26/20 1404  BP: 130/80  Pulse: 88  Temp: 98.4 F (36.9 C)  TempSrc: Temporal  SpO2: 97%  Weight: 185 lb (83.9 kg)  Height: 5' 9.5" (1.765 m)     Body mass index is 26.93 kg/m.   Physical Exam:    Physical Exam Vitals and nursing note reviewed.  Constitutional:      General: He is not in acute distress.    Appearance: He is well-developed. He is not ill-appearing, toxic-appearing or  sickly-appearing.  Cardiovascular:     Rate and Rhythm: Normal rate and regular rhythm.     Pulses: Normal pulses.     Heart sounds: Normal heart sounds, S1 normal and S2 normal.     Comments: No LE edema Pulmonary:     Effort: Pulmonary effort is normal.     Breath sounds: Normal breath sounds.  Skin:    General: Skin is warm, dry and intact.  Neurological:     Mental Status: He is alert.     GCS: GCS eye subscore is 4. GCS verbal subscore is 5. GCS motor subscore is 6.  Psychiatric:        Mood and Affect: Mood and affect normal.        Speech: Speech normal.        Behavior: Behavior normal. Behavior is cooperative.      Assessment and Plan:    John Kline was seen today for establish care and adhd.  Diagnoses and all orders for this visit:  Attention deficit hyperactivity disorder (ADHD), unspecified ADHD type Overall controlled but we are going to try slightly more simplified dosing of Adderall XR 25 mg BID. Follow-up in 2 months, sooner if concerns. Controlled substance contract completed today. PDMP reviewed - no red flags.  Other orders -     amphetamine-dextroamphetamine (ADDERALL XR) 25 MG 24 hr capsule; Take 1 capsule by mouth in the morning and at bedtime. -     amphetamine-dextroamphetamine (ADDERALL XR) 25 MG 24 hr capsule; Take 1 capsule by mouth in the morning and at bedtime. -     amphetamine-dextroamphetamine (ADDERALL XR) 25 MG 24 hr capsule; Take 1 capsule by mouth in the morning and at bedtime.    CMA or LPN served as scribe during this visit. History, Physical, and Plan performed by medical provider. The above documentation has been reviewed and is accurate and complete.   Jarold Motto, PA-C [place] [place] 11/26/2020  Follow-up: No follow-ups on file.

## 2020-11-26 NOTE — Addendum Note (Signed)
Addended by: Haynes Bast on: 11/26/2020 04:50 PM   Modules accepted: Orders

## 2020-11-26 NOTE — Patient Instructions (Signed)
It was great to see you!  I have sent in 3 months of refills however, I would like for you to follow-up with me sometime before mid-May so I can send in another 3 months prior to my maternity leave.  Take care,  Jarold Motto PA-C

## 2020-11-27 ENCOUNTER — Telehealth: Payer: Self-pay | Admitting: *Deleted

## 2020-11-27 NOTE — Telephone Encounter (Signed)
Called [facility] and spoke to Jonny Ruiz told him PA for pt to take 2 capsules daily of Adderall XR 25 mg has been approved for one year. John verbalized understanding and will run medication for pt.

## 2020-11-27 NOTE — Telephone Encounter (Signed)
Received fax from pharmacy needs PA for Adderall 2 tabs daily.  Called pt's insurance [facility] pharmacy at [phone] and spoke to [place]. Told him calling for PA for pt to take 2 capsules daily for ADHD. Answered all Mark's questions and PA was approved for one year.  Approved: 11/27/2020- 11/27/2021 Ref # [id number]

## 2020-11-29 ENCOUNTER — Telehealth: Payer: Self-pay | Admitting: *Deleted

## 2020-11-29 NOTE — Telephone Encounter (Signed)
[facility] spoke to John Kline Kline, told him I called [employer] again and they are going to fix the Rx they forgot to edit quantity per month but was approved. They said to run Rx in about 30 minutes or so and it should go through. John Kline verbalized understanding and said okay. Told hm I will call back after lunch to make sure approved and then I will let pt know. John Kline verbalized understanding.

## 2020-11-29 NOTE — Telephone Encounter (Signed)
[facility]ed [facility]  and spoke to [place] asked him Rx was fixed? John ran Rx again and said yes we will give him a refund. Told him thank you.

## 2020-11-29 NOTE — Telephone Encounter (Signed)
[facility] and spoke to Jonny Ruiz, told him calling about pt's Rx for Adderall. Pt sent me message saying he pain out of pocket due to denied. Told John I spoke to you on 2/22 and told you Rx was approved for 2 tablets daily. John said he remembered but when they ran the Rx it did not go through. He said he is not sure if pt has aq deductible or what. Told him I did receive the approval but it went to scan already. He said pt showed the approval on his phone too. Told him I will call the insurance again and see what it wrong. John said if it is straightened out they will reimburse pt. Told him okay will get back to you.

## 2020-11-29 NOTE — Telephone Encounter (Signed)
Called pt told him I saw your message this morning. I had contacted the pharmacy and called [employer] again they forgot to edit the quantity for 2 capsules daily. It has been fixed now and I just spoke to Pharmacist Jonny Ruiz he ran the Rx again and it went through. He said they will refund your money. Pt verbalized understanding. Told him you should not have any trouble going forward with your prescriptions. Pt verbalized understanding and said thank so much.

## 2020-11-29 NOTE — Telephone Encounter (Signed)
Called [facility] and spoke to [place], told him calling about PA for Adderall 25 mg 2 capsules daily. I called on 2/22 and it was approved pt went to pharmacy and it was denied and had to pay out of pocket.  Allyson Sabal said the Rx was approved but they did not edit quantity per month. Allyson Sabal said he will edit the Rx for quantity for 2 capsules per day, 60 per month and it will take up to 30 minutes or so, same Ref # [id number]. Told him okay, thank you.

## 2021-01-26 ENCOUNTER — Other Ambulatory Visit: Payer: Self-pay | Admitting: Physician Assistant

## 2021-01-28 NOTE — Telephone Encounter (Signed)
I am not going to approve this refill because it looks like he should have at least one refill remaining that was sent at his last visit and I also asked him to follow-up with me this month to review this.

## 2021-02-22 ENCOUNTER — Telehealth (INDEPENDENT_AMBULATORY_CARE_PROVIDER_SITE_OTHER): Payer: 59 | Admitting: Family Medicine

## 2021-02-22 ENCOUNTER — Encounter: Payer: Self-pay | Admitting: Family Medicine

## 2021-02-22 DIAGNOSIS — F909 Attention-deficit hyperactivity disorder, unspecified type: Secondary | ICD-10-CM | POA: Diagnosis not present

## 2021-02-22 DIAGNOSIS — E785 Hyperlipidemia, unspecified: Secondary | ICD-10-CM | POA: Diagnosis not present

## 2021-02-22 MED ORDER — AMPHETAMINE-DEXTROAMPHET ER 25 MG PO CP24: 25.0000 mg | ORAL_CAPSULE | Freq: Two times a day (BID) | ORAL | 0 refills | Status: DC

## 2021-02-22 NOTE — Progress Notes (Signed)
   John Kline is a 25 y.o. male who presents today for a virtual office visit.  Assessment/Plan:  Chronic Problems Addressed Today: ADHD Database without red flags.  Tolerating Adderall well without side effects.  Medication helps with ability to stay focused and on task.  We will send in refill today.  Follow-up with me or PCP in 3 months.  Dyslipidemia I [facility].  Found to have high LDL.  He has made a lot of dietary changes.  He will send [place] his lab records.  Encouraged continued lifestyle modifications.     Subjective:  HPI:  See A/p.         Objective/Observations  Physical Exam: Gen: NAD, resting comfortably Pulm: Normal work of breathing Neuro: Grossly normal, moves all extremities Psych: Normal affect and thought content  Virtual Visit via Video   I connected with Thurmond Prather on 02/22/21 at  4:00 PM EDT by a video enabled telemedicine application and verified that I am speaking with the correct person using two identifiers. The limitations of evaluation and management by telemedicine and the availability of in person appointments were discussed. The patient expressed understanding and agreed to proceed.   Patient location: Home Provider location:  [place] [employer] Persons participating in the virtual visit: Myself and Patient     Katina Degree. Jimmey Ralph, MD 02/22/2021 9:07 AM

## 2021-02-22 NOTE — Assessment & Plan Note (Signed)
I had [facility].  Found to have high LDL.  He has made a lot of dietary changes.  He will send [place] his lab records.  Encouraged continued lifestyle modifications.

## 2021-02-22 NOTE — Assessment & Plan Note (Addendum)
Database without red flags.  Tolerating Adderall well without side effects.  Medication helps with ability to stay focused and on task.  We will send in refill today.  Follow-up with me or PCP in 3 months.

## 2021-02-25 ENCOUNTER — Telehealth: Payer: 59 | Admitting: Physician Assistant

## 2021-05-28 ENCOUNTER — Other Ambulatory Visit: Payer: Self-pay | Admitting: Physician Assistant

## 2021-05-28 ENCOUNTER — Telehealth: Payer: Self-pay

## 2021-05-28 ENCOUNTER — Encounter: Payer: Self-pay | Admitting: Family Medicine

## 2021-05-28 MED ORDER — AMPHETAMINE-DEXTROAMPHET ER 25 MG PO CP24: 25.0000 mg | ORAL_CAPSULE | Freq: Two times a day (BID) | ORAL | 0 refills | Status: DC

## 2021-05-28 NOTE — Telephone Encounter (Signed)
Spoke to pt told him Rx for Adderall was sent to pharmacy but must keep appt on 8/26 for further refills. Pt verbalized understanding.

## 2021-05-28 NOTE — Telephone Encounter (Signed)
LAST APPOINTMENT DATE: 02/22/21  NEXT APPOINTMENT DATE: 05/31/21  MEDICATION:amphetamine-dextroamphetamine (ADDERALL XR) 25 MG 24 hr capsule   [facility]Y:[id number] [place], [place] - [address]

## 2021-05-28 NOTE — Telephone Encounter (Signed)
Pt requesting refill on Adderall XR 25 mg. Last OV 02/2021. Has an appt 05/31/2021.

## 2021-05-31 ENCOUNTER — Ambulatory Visit: Payer: 59 | Admitting: Physician Assistant

## 2021-05-31 ENCOUNTER — Encounter: Payer: Self-pay | Admitting: Physician Assistant

## 2021-05-31 ENCOUNTER — Other Ambulatory Visit: Payer: Self-pay

## 2021-05-31 VITALS — BP 110/70 | HR 118 | Temp 98.0°F | Ht 69.5 in | Wt 187.0 lb

## 2021-05-31 DIAGNOSIS — F909 Attention-deficit hyperactivity disorder, unspecified type: Secondary | ICD-10-CM | POA: Diagnosis not present

## 2021-05-31 MED ORDER — AMPHETAMINE-DEXTROAMPHET ER 25 MG PO CP24: 25.0000 mg | ORAL_CAPSULE | Freq: Two times a day (BID) | ORAL | 0 refills | Status: DC

## 2021-05-31 NOTE — Patient Instructions (Signed)
It was great to see you!  Refills for 3 months  Call me in 3 months and I will send in refills  Lets follow-up in 6 months for a visit  Take care,  Jarold Motto PA-C

## 2021-05-31 NOTE — Progress Notes (Signed)
John Kline is a 25 y.o. male is here for follow  up.  I acted as a Neurosurgeon for [employer], PA-C Corky Mull, LPN   History of Present Illness:   Chief Complaint  Patient presents with   ADHD    HPI  ADHD 3 month f/u currently taking Adderall XR 25 mg BID. Tolerating medication well without side effects.  Medication helps with ability to stay focused and on task, he says he feels great since changed time of taking medication. Denies: chest pain, palpitations, SOB, insomnia   Health Maintenance Due  Topic Date Due   Hepatitis C Screening  Never done    Past Medical History:  Diagnosis Date   ADHD (attention deficit hyperactivity disorder)    dx'd approx 8th grade.  [facility] [place].   Allergy 11/20/2020   Pencillin, Amoxicillin, Sulfa Products, and Zofran   Anxiety disorder    medications included zoloft in high school   History of meningitis age 53   viral   Mild intermittent asthma    childhood; quiescent for the most part as adult     Social History   Tobacco Use   Smoking status: Former    Packs/day: 0.00    Years: 0.00    Pack years: 0.00    Types: Cigarettes   Smokeless tobacco: Never  Vaping Use   Vaping Use: Never used  Substance Use Topics   Alcohol use: Yes    Alcohol/week: 2.0 standard drinks    Types: 2 Standard drinks or equivalent per week   Drug use: No    History reviewed. No pertinent surgical history.  Family History  Problem Relation Age of Onset   Graves' disease Mother    Uterine cancer Maternal Grandmother    Arthritis Maternal Grandmother    Cancer Maternal Grandmother    Early death Maternal Grandmother    Obesity Maternal Grandmother    Alcohol abuse Maternal Grandfather    Early death Maternal Grandfather     PMHx, SurgHx, SocialHx, FamHx, Medications, and Allergies were reviewed in the Visit Navigator and updated as appropriate.   Patient Active Problem List    Diagnosis Date Noted   Dyslipidemia 02/22/2021   ADHD 12/11/2015   Anxiety disorder 12/11/2015    Social History   Tobacco Use   Smoking status: Former    Packs/day: 0.00    Years: 0.00    Pack years: 0.00    Types: Cigarettes   Smokeless tobacco: Never  Vaping Use   Vaping Use: Never used  Substance Use Topics   Alcohol use: Yes    Alcohol/week: 2.0 standard drinks    Types: 2 Standard drinks or equivalent per week   Drug use: No    Current Medications and Allergies:    Current Outpatient Medications:    amphetamine-dextroamphetamine (ADDERALL XR) 25 MG 24 hr capsule, Take 1 capsule by mouth in the morning and at bedtime., Disp: 60 capsule, Rfl: 0   amphetamine-dextroamphetamine (ADDERALL XR) 25 MG 24 hr capsule, Take 1 capsule by mouth 2 (two) times daily., Disp: 60 capsule, Rfl: 0   [START ON 06/30/2021] amphetamine-dextroamphetamine (ADDERALL XR) 25 MG 24 hr capsule, Take 1 capsule by mouth 2 (two) times daily., Disp: 60 capsule, Rfl: 0   Allergies  Allergen Reactions   Zofran [Ondansetron Hcl] Anaphylaxis    Allergic reaction noted at 1833   Amoxicillin    Penicillins Rash   Sulfa Antibiotics Rash    Review  of Systems   ROS Negative unless otherwise specified per HPI.  Vitals:   Vitals:   05/31/21 1435  BP: 110/70  Pulse: (!) 118  Temp: 98 F (36.7 C)  TempSrc: Temporal  SpO2: 96%  Weight: 187 lb (84.8 kg)  Height: 5' 9.5" (1.765 m)     Body mass index is 27.22 kg/m.   Physical Exam:    Physical Exam Vitals and nursing note reviewed.  Constitutional:      General: He is not in acute distress.    Appearance: He is well-developed. He is not ill-appearing or toxic-appearing.  Cardiovascular:     Rate and Rhythm: Regular rhythm. Tachycardia present.     Pulses: Normal pulses.     Heart sounds: Normal heart sounds, S1 normal and S2 normal.     Comments: No LE edema Pulmonary:     Effort: Pulmonary effort is normal.     Breath sounds: Normal  breath sounds.  Skin:    General: Skin is warm and dry.  Neurological:     Mental Status: He is alert.     GCS: GCS eye subscore is 4. GCS verbal subscore is 5. GCS motor subscore is 6.  Psychiatric:        Speech: Speech normal.        Behavior: Behavior normal. Behavior is cooperative.     Assessment and Plan:    John Kline was seen today for adhd.  Diagnoses and all orders for this visit:  Attention deficit hyperactivity disorder (ADHD), unspecified ADHD type  Other orders -     amphetamine-dextroamphetamine (ADDERALL XR) 25 MG 24 hr capsule; Take 1 capsule by mouth 2 (two) times daily. -     amphetamine-dextroamphetamine (ADDERALL XR) 25 MG 24 hr capsule; Take 1 capsule by mouth 2 (two) times daily.  No red flags  PDMP reviewed Refill Adderall XR 25 mg BID x 6 months Advised patient to message [place] when needs refill in 3 months Follow-up in 6 months, sooner if concerns  CMA or LPN served as scribe during this visit. History, Physical, and Plan performed by medical provider. The above documentation has been reviewed and is accurate and complete.  Jarold Motto, PA-C [place], [place] 05/31/2021  Follow-up: No follow-ups on file.

## 2021-08-28 ENCOUNTER — Telehealth: Payer: Self-pay

## 2021-08-28 ENCOUNTER — Other Ambulatory Visit: Payer: Self-pay | Admitting: Physician Assistant

## 2021-08-28 MED ORDER — AMPHETAMINE-DEXTROAMPHET ER 25 MG PO CP24: 25.0000 mg | ORAL_CAPSULE | Freq: Two times a day (BID) | ORAL | 0 refills | Status: DC

## 2021-08-28 MED ORDER — AMPHETAMINE-DEXTROAMPHET ER 25 MG PO CP24
25.0000 mg | ORAL_CAPSULE | Freq: Two times a day (BID) | ORAL | 0 refills | Status: DC
Start: 2021-10-27 — End: 2021-11-26

## 2021-08-28 NOTE — Telephone Encounter (Signed)
LAST APPOINTMENT DATE:  05/31/21  NEXT APPOINTMENT DATE: 12/02/21  MEDICATION:amphetamine-dextroamphetamine (ADDERALL XR) 25 MG 24 hr capsule   [facility]ACY: [id number] [place], [place] - [address]

## 2021-08-28 NOTE — Telephone Encounter (Signed)
Pt requesting refill for Adderall XR 25 mg, Last OV 05/31/2021, scheduled to see you 12/02/2021.

## 2021-11-26 ENCOUNTER — Other Ambulatory Visit: Payer: Self-pay | Admitting: Physician Assistant

## 2021-11-26 MED ORDER — AMPHETAMINE-DEXTROAMPHET ER 25 MG PO CP24: 25.0000 mg | ORAL_CAPSULE | Freq: Two times a day (BID) | ORAL | 0 refills | Status: DC

## 2021-11-26 NOTE — Telephone Encounter (Signed)
Pt requesting refill for Adderall XR 25 mg BID. Scheduled to see you 12/02/2021.

## 2021-11-26 NOTE — Telephone Encounter (Signed)
Pt is completely out of his Adderal. He is requesting a refill today asap. Please [phone].

## 2021-11-26 NOTE — Telephone Encounter (Signed)
Pt notified Rx was sent to pharmacy. 

## 2021-12-02 ENCOUNTER — Encounter: Payer: Self-pay | Admitting: Physician Assistant

## 2021-12-02 ENCOUNTER — Other Ambulatory Visit: Payer: Self-pay

## 2021-12-02 ENCOUNTER — Ambulatory Visit: Payer: 59 | Admitting: Physician Assistant

## 2021-12-02 VITALS — BP 124/70 | HR 74 | Temp 98.1°F | Ht 69.5 in | Wt 193.4 lb

## 2021-12-02 DIAGNOSIS — F909 Attention-deficit hyperactivity disorder, unspecified type: Secondary | ICD-10-CM | POA: Diagnosis not present

## 2021-12-02 MED ORDER — AMPHETAMINE-DEXTROAMPHET ER 25 MG PO CP24: 25.0000 mg | ORAL_CAPSULE | Freq: Two times a day (BID) | ORAL | 0 refills | Status: DC

## 2021-12-02 NOTE — Progress Notes (Signed)
John Kline is a 26 y.o. male here for a follow up of ADHD.   History of Present Illness:   Chief Complaint  Patient presents with   ADHD    HPI  ADHD John Kline is here for a 6 month f/u of ADHD. Pt is currently compliant with taking Adderall XR 25 mg BID with no adverse effects. States he has noticed weight gain since his promotion due to new work hours and not going to the gym as often. At this time he is working to make a reasonable daily routine for him and his wife to follow. Despite this he finds his medication to be beneficial and is managing well.  Denies chest pain, palpitations, SOB or insomnia.    Past Medical History:  Diagnosis Date   ADHD (attention deficit hyperactivity disorder)    dx'd approx 8th grade.  [facility] [place].   Allergy 11/20/2020   Pencillin, Amoxicillin, Sulfa Products, and Zofran   Anxiety disorder    medications included zoloft in high school   History of meningitis age 72   viral   Mild intermittent asthma    childhood; quiescent for the most part as adult     Social History   Tobacco Use   Smoking status: Former    Packs/day: 0.00    Years: 0.00    Pack years: 0.00    Types: Cigarettes   Smokeless tobacco: Never  Vaping Use   Vaping Use: Never used  Substance Use Topics   Alcohol use: Yes    Alcohol/week: 2.0 standard drinks    Types: 2 Standard drinks or equivalent per week   Drug use: No    History reviewed. No pertinent surgical history.  Family History  Problem Relation Age of Onset   Graves' disease Mother    Uterine cancer Maternal Grandmother    Arthritis Maternal Grandmother    Cancer Maternal Grandmother    Early death Maternal Grandmother    Obesity Maternal Grandmother    Alcohol abuse Maternal Grandfather    Early death Maternal Grandfather     Allergies  Allergen Reactions   Zofran [Ondansetron Hcl] Anaphylaxis    Allergic reaction noted at 1833   Amoxicillin     Penicillins Rash   Sulfa Antibiotics Rash    Current Medications:   Current Outpatient Medications:    amphetamine-dextroamphetamine (ADDERALL XR) 25 MG 24 hr capsule, Take 1 capsule by mouth 2 (two) times daily., Disp: 60 capsule, Rfl: 0   Review of Systems:   ROS Negative unless otherwise specified per HPI.  Vitals:   Vitals:   12/02/21 0904  BP: 124/70  Pulse: 74  Temp: 98.1 F (36.7 C)  TempSrc: Temporal  SpO2: 97%  Weight: 193 lb 6.1 oz (87.7 kg)  Height: 5' 9.5" (1.765 m)     Body mass index is 28.15 kg/m.  Physical Exam:   Physical Exam Vitals and nursing note reviewed.  Constitutional:      General: He is not in acute distress.    Appearance: He is well-developed. He is not ill-appearing or toxic-appearing.  Cardiovascular:     Rate and Rhythm: Normal rate and regular rhythm.     Pulses: Normal pulses.     Heart sounds: Normal heart sounds, S1 normal and S2 normal.  Pulmonary:     Effort: Pulmonary effort is normal.     Breath sounds: Normal breath sounds.  Skin:    General: Skin is warm and dry.  Neurological:     Mental Status: He is alert.     GCS: GCS eye subscore is 4. GCS verbal subscore is 5. GCS motor subscore is 6.  Psychiatric:        Speech: Speech normal.        Behavior: Behavior normal. Behavior is cooperative.    Assessment and Plan:   Attention deficit hyperactivity disorder (ADHD), unspecified ADHD type No red flags, PDMP reviewed  Continue Adderall XR 25 mg BID  Advised patient to message [place] when in need of refills in 3 months Follow up in 6 months for physical, sooner if concerns   I,John Kline,acting as a scribe for [employer], PA.,have documented all relevant documentation on the behalf of John Motto, PA,as directed by  John Motto, PA while in the presence of John Kline, [place].  I, John Kline, [place], have reviewed all documentation for this visit. The documentation on 12/02/21 for the exam,  diagnosis, procedures, and orders are all accurate and complete.   John Motto, PA-C

## 2022-03-26 ENCOUNTER — Telehealth: Payer: Self-pay | Admitting: Physician Assistant

## 2022-03-26 ENCOUNTER — Other Ambulatory Visit: Payer: Self-pay | Admitting: Physician Assistant

## 2022-03-26 MED ORDER — AMPHETAMINE-DEXTROAMPHET ER 25 MG PO CP24: 25.0000 mg | ORAL_CAPSULE | Freq: Two times a day (BID) | ORAL | 0 refills | Status: DC

## 2022-03-26 NOTE — Telephone Encounter (Signed)
Spoke to pt told him Samantha sent his refill to the pharmacy, just make sure you keep your appt for August.  Pt verbalized understanding.

## 2022-03-26 NOTE — Telephone Encounter (Signed)
   LAST APPOINTMENT DATE:   12/02/2021 - OV  NEXT APPOINTMENT DATE: 05/26/2022 - OV   MEDICATION: amphetamine-dextroamphetamine (ADDERALL XR) 25 MG 24 hr capsule [[id number]]  ENDED   Is the patient out of medication?  Yes, "will run out tomorrow"   [facility]ACY: [id number] [place], [place] - [address]  [place], [place] [address]  Phone:  [phone]  Fax:  [phone]

## 2022-03-26 NOTE — Telephone Encounter (Signed)
Pt requesting refill for Adderall XR 25 mg, Last OV 12/02/2021, pt is scheduled for 05/26/2022.

## 2022-05-26 ENCOUNTER — Encounter: Payer: Self-pay | Admitting: Physician Assistant

## 2022-05-26 ENCOUNTER — Ambulatory Visit (INDEPENDENT_AMBULATORY_CARE_PROVIDER_SITE_OTHER): Payer: 59 | Admitting: Physician Assistant

## 2022-05-26 VITALS — BP 110/70 | HR 71 | Temp 98.6°F | Ht 69.5 in | Wt 201.5 lb

## 2022-05-26 DIAGNOSIS — Z23 Encounter for immunization: Secondary | ICD-10-CM

## 2022-05-26 DIAGNOSIS — E785 Hyperlipidemia, unspecified: Secondary | ICD-10-CM

## 2022-05-26 DIAGNOSIS — E663 Overweight: Secondary | ICD-10-CM | POA: Diagnosis not present

## 2022-05-26 DIAGNOSIS — F909 Attention-deficit hyperactivity disorder, unspecified type: Secondary | ICD-10-CM | POA: Diagnosis not present

## 2022-05-26 DIAGNOSIS — Z0000 Encounter for general adult medical examination without abnormal findings: Secondary | ICD-10-CM | POA: Diagnosis not present

## 2022-05-26 DIAGNOSIS — Z1159 Encounter for screening for other viral diseases: Secondary | ICD-10-CM

## 2022-05-26 LAB — COMPREHENSIVE METABOLIC PANEL
ALT: 32 U/L (ref 0–53)
AST: 25 U/L (ref 0–37)
Albumin: 4.7 g/dL (ref 3.5–5.2)
Alkaline Phosphatase: 71 U/L (ref 39–117)
BUN: 15 mg/dL (ref 6–23)
CO2: 30 mEq/L (ref 19–32)
Calcium: 9.9 mg/dL (ref 8.4–10.5)
Chloride: 100 mEq/L (ref 96–112)
Creatinine, Ser: 1.17 mg/dL (ref 0.40–1.50)
GFR: 84.81 mL/min (ref >60.00)
Glucose, Bld: 93 mg/dL (ref 70–99)
Potassium: 4.5 mEq/L (ref 3.5–5.1)
Sodium: 138 mEq/L (ref 135–145)
Total Bilirubin: 0.7 mg/dL (ref 0.2–1.2)
Total Protein: 6.9 g/dL (ref 6.0–8.3)

## 2022-05-26 LAB — CBC WITH DIFFERENTIAL/PLATELET
Basophils Absolute: 0 10*3/uL (ref 0.0–0.1)
Basophils Relative: 0.9 % (ref 0.0–3.0)
Eosinophils Absolute: 0.1 10*3/uL (ref 0.0–0.7)
Eosinophils Relative: 2.6 % (ref 0.0–5.0)
HCT: 48.2 % (ref 39.0–52.0)
Hemoglobin: 16.5 g/dL (ref 13.0–17.0)
Lymphocytes Relative: 34.7 % (ref 12.0–46.0)
Lymphs Abs: 1.3 10*3/uL (ref 0.7–4.0)
MCHC: 34.3 g/dL (ref 30.0–36.0)
MCV: 91.1 fl (ref 78.0–100.0)
Monocytes Absolute: 0.3 10*3/uL (ref 0.1–1.0)
Monocytes Relative: 9.3 % (ref 3.0–12.0)
Neutro Abs: 1.9 10*3/uL (ref 1.4–7.7)
Neutrophils Relative %: 52.5 % (ref 43.0–77.0)
Platelets: 176 10*3/uL (ref 150.0–400.0)
RBC: 5.29 Mil/uL (ref 4.22–5.81)
RDW: 12 % (ref 11.5–15.5)
WBC: 3.7 10*3/uL — ABNORMAL LOW (ref 4.0–10.5)

## 2022-05-26 LAB — LIPID PANEL
Cholesterol: 188 mg/dL (ref 0–200)
HDL: 59.1 mg/dL (ref >39.00)
LDL Cholesterol: 118 mg/dL — ABNORMAL HIGH (ref 0–99)
NonHDL: 128.99
Total CHOL/HDL Ratio: 3
Triglycerides: 53 mg/dL (ref 0.0–149.0)
VLDL: 10.6 mg/dL (ref 0.0–40.0)

## 2022-05-26 MED ORDER — AMPHETAMINE-DEXTROAMPHET ER 25 MG PO CP24: 25.0000 mg | ORAL_CAPSULE | ORAL | 0 refills | Status: DC

## 2022-05-26 NOTE — Patient Instructions (Addendum)
It was great to see you!  Please go to the lab for blood work.   I will place referral for dietitian.  Our office will call you with your results unless you have chosen to receive results via MyChart.  If your blood work is normal we will follow-up each year for physicals and as scheduled for chronic medical problems.  If anything is abnormal we will treat accordingly and get you in for a follow-up.  Take care,  Lelon Mast

## 2022-05-26 NOTE — Progress Notes (Signed)
Subjective:    John Kline is a 26 y.o. male and is here for a comprehensive physical exam.  HPI  Health Maintenance Due  Topic Date Due   Hepatitis C Screening  Never done   INFLUENZA VACCINE  05/06/2022    Acute Concerns: None  Chronic Issues: ADHD -- currently taking adderall 25 mg xr daily. Tolerating well. Denies issues with anxiety/depression.  Health Maintenance: Immunizations -- flu shot today Colonoscopy -- n/a PSA -- No results found for: "PSA1", "PSA" Diet -- working on healthy diet Sleep habits -- no major concerns Exercise -- currently active at gym Weight -- Weight: 201 lb 8 oz (91.4 kg)  Weight history Wt Readings from Last 10 Encounters:  05/26/22 201 lb 8 oz (91.4 kg)  12/02/21 193 lb 6.1 oz (87.7 kg)  05/31/21 187 lb (84.8 kg)  11/26/20 185 lb (83.9 kg)  10/05/17 173 lb (78.5 kg)  10/09/14 207 lb (93.9 kg)  01/08/13 185 lb (83.9 kg) (86 %, Z= 1.08)*   * Growth percentiles are based on CDC (Boys, 2-20 Years) data.   Body mass index is 29.33 kg/m. Mood -- stable Tobacco use --  Tobacco Use: Medium Risk (05/26/2022)   Patient History    Smoking Tobacco Use: Former    Smokeless Tobacco Use: Never    Passive Exposure: Not on file    Alcohol use ---  reports current alcohol use of about 2.0 standard drinks of alcohol per week.      05/26/2022   10:05 AM  Depression screen PHQ 2/9  Decreased Interest 0  Down, Depressed, Hopeless 0  PHQ - 2 Score 0     Other providers/specialists: Patient Care Team: Jarold Motto, [place] as PCP - General (Physician Assistant)   PMHx, SurgHx, SocialHx, Medications, and Allergies were reviewed in the Visit Navigator and updated as appropriate.   Past Medical History:  Diagnosis Date   ADHD (attention deficit hyperactivity disorder)    dx'd approx 8th grade.  [facility] [place]   Allergy 11/20/2020   Pencillin, Amoxicillin, Sulfa Products, and Zofran    Anxiety disorder    medications included zoloft in high school   History of meningitis age 67   viral   Mild intermittent asthma    childhood; quiescent for the most part as adult    History reviewed. No pertinent surgical history.   Family History  Problem Relation Age of Onset   Graves' disease Mother    Uterine cancer Maternal Grandmother    Arthritis Maternal Grandmother    Early death Maternal Grandmother    Obesity Maternal Grandmother    Alcohol abuse Maternal Grandfather    Early death Maternal Grandfather    Prostate cancer Neg Hx    Colon cancer Neg Hx     Social History   Tobacco Use   Smoking status: Former    Packs/day: 0.00    Years: 0.00    Total pack years: 0.00    Types: Cigarettes   Smokeless tobacco: Never  Vaping Use   Vaping Use: Never used  Substance Use Topics   Alcohol use: Yes    Alcohol/week: 2.0 standard drinks of alcohol    Types: 2 Standard drinks or equivalent per week    Comment: trying to get down 1-2 drinks per week   Drug use: No    Review of Systems:   Review of Systems  Constitutional:  Negative for chills, fever, malaise/fatigue and weight loss.  HENT:  Negative for hearing loss, sinus pain and sore throat.   Respiratory:  Negative for cough and hemoptysis.   Cardiovascular:  Negative for chest pain, palpitations, leg swelling and PND.  Gastrointestinal:  Negative for abdominal pain, constipation, diarrhea, heartburn, nausea and vomiting.  Genitourinary:  Negative for dysuria, frequency and urgency.  Musculoskeletal:  Negative for back pain, myalgias and neck pain.  Skin:  Negative for itching and rash.  Neurological:  Negative for dizziness, tingling, seizures and headaches.  Endo/Heme/Allergies:  Negative for polydipsia.  Psychiatric/Behavioral:  Negative for depression. The patient is not nervous/anxious.     Objective:   Vitals:   05/26/22 1005  BP: 110/70  Pulse: 71  Temp: 98.6 F (37 C)  SpO2: 97%   Body  mass index is 29.33 kg/m.  General Appearance:  Alert, cooperative, no distress, appears stated age  Head:  Normocephalic, without obvious abnormality, atraumatic  Eyes:  PERRL, conjunctiva/corneas clear, EOM's intact, fundi benign, both eyes       Ears:  Normal TM's and external ear canals, both ears  Nose: Nares normal, septum midline, mucosa normal, no drainage    or sinus tenderness  Throat: Lips, mucosa, and tongue normal; teeth and gums normal  Neck: Supple, symmetrical, trachea midline, no adenopathy; thyroid:  No enlargement/tenderness/nodules; no carotit bruit or JVD  Back:   Symmetric, no curvature, ROM normal, no CVA tenderness  Lungs:   Clear to auscultation bilaterally, respirations unlabored  Chest wall:  No tenderness or deformity  Heart:  Regular rate and rhythm, S1 and S2 normal, no murmur, rub   or gallop  Abdomen:   Soft, non-tender, bowel sounds active all four quadrants, no masses, no organomegaly  Extremities: Extremities normal, atraumatic, no cyanosis or edema  Prostate: Not done.   Skin: Skin color, texture, turgor normal, no rashes or lesions  Lymph nodes: Cervical, supraclavicular, and axillary nodes normal  Neurologic: CNII-XII grossly intact. Normal strength, sensation and reflexes throughout    Assessment/Plan:   Routine physical examination Today patient counseled on age appropriate routine health concerns for screening and prevention, each reviewed and up to date or declined. Immunizations reviewed and up to date or declined. Labs ordered and reviewed. Risk factors for depression reviewed and negative. Hearing function and visual acuity are intact. ADLs screened and addressed as needed. Functional ability and level of safety reviewed and appropriate. Education, counseling and referrals performed based on assessed risks today. Patient provided with a copy of personalized plan for preventive services.  Overweight Continue efforts at healthy  lifestyle  Attention deficit hyperactivity disorder (ADHD), unspecified ADHD type Well controlled Continue Adderall 25 mg XR daily  Dyslipidemia Update lipid panel and make recommendations accordingly  Encounter for screening for other viral diseases Update Hep C testing today  Patient Counseling: [x]   Nutrition: Stressed importance of moderation in sodium/caffeine intake, saturated fat and cholesterol, caloric balance, sufficient intake of fresh fruits, vegetables, and fiber.  [x]   Stressed the importance of regular exercise.   []   Substance Abuse: Discussed cessation/primary prevention of tobacco, alcohol, or other drug use; driving or other dangerous activities under the influence; availability of treatment for abuse.   [x]   Injury prevention: Discussed safety belts, safety helmets, smoke detector, smoking near bedding or upholstery.   []   Sexuality: Discussed sexually transmitted diseases, partner selection, use of condoms, avoidance of unintended pregnancy  and contraceptive alternatives.   [x]   Dental health: Discussed importance of regular tooth brushing, flossing, and dental visits.  [x]   Health maintenance  and immunizations reviewed. Please refer to Health maintenance section.    Jarold Motto, PA-C [facility]

## 2022-05-27 LAB — HEPATITIS C ANTIBODY: Hepatitis C Ab: NONREACTIVE

## 2022-06-23 ENCOUNTER — Ambulatory Visit: Payer: 59 | Admitting: Dietician

## 2022-06-25 ENCOUNTER — Telehealth: Payer: Self-pay | Admitting: Physician Assistant

## 2022-06-25 ENCOUNTER — Encounter: Payer: Self-pay | Admitting: Physician Assistant

## 2022-06-25 ENCOUNTER — Other Ambulatory Visit: Payer: Self-pay | Admitting: Physician Assistant

## 2022-06-25 MED ORDER — AMPHETAMINE-DEXTROAMPHET ER 25 MG PO CP24: 25.0000 mg | ORAL_CAPSULE | Freq: Two times a day (BID) | ORAL | 0 refills | Status: DC

## 2022-06-25 NOTE — Telephone Encounter (Signed)
Patient states: - He went to pick up prescription listed below and found quantity was changed to 30 pills instead of 60  - He takes two pills a day and has always had 60 tablets sent in of medication -He is worried he won't be able to pick up corrected prescription since he already picked up the one with the wrong quantity  Patient requests: -Corrected dosage be sent in ASAP  MEDICATION:amphetamine-dextroamphetamine (ADDERALL XR) 25 MG 24 hr capsule    PHARMACY: [id number] - [place], [address]  [address] [place], [place] [address]  Phone:  [phone]  Fax:  [phone]

## 2022-06-25 NOTE — Telephone Encounter (Signed)
[facility] and [facility] [place], told her new Rx's were sent in for Adderall XR 25 twice a day. The provider sent in the wrong directions and quantity. Please cancel other Rx's. Erline Levine verbalized understanding and clarified pt to take 2 a day. I said yes. Erline Levine said she will cancel other Rx's and pt will be able to get new Rx in 14 days. Told her okay.

## 2022-06-25 NOTE — Telephone Encounter (Signed)
I have spoken with pharmacist at [facility] on [address].  The pharmacist has advised for patient to take script he has picked up and has asked for him to reach out to them in 14 days for a refill.   States insurance will cover script at that point.   I have advised patient.

## 2022-06-25 NOTE — Telephone Encounter (Signed)
Spoke to pt told him I called the pharmacy and they said you can take 2 capsules daily and in 14 days you will be able to get new Rx with new directions and correct quantity. Insurance will cover since it is new directions and quantity amount from last Rx. Pt verbalized understanding.

## 2022-06-25 NOTE — Telephone Encounter (Signed)
Please see message. °

## 2022-09-06 ENCOUNTER — Encounter: Payer: Self-pay | Admitting: Physician Assistant

## 2022-09-07 ENCOUNTER — Encounter: Payer: Self-pay | Admitting: Physician Assistant

## 2022-09-08 MED ORDER — ADDERALL XR 25 MG PO CP24: 25.0000 mg | ORAL_CAPSULE | Freq: Two times a day (BID) | ORAL | 0 refills | Status: DC

## 2022-09-08 NOTE — Telephone Encounter (Signed)
Spoke to pt, he checked with insurance and Adderall XR 25 mg twice a day will be covered under Tier 1 if written Brand Necessary. Told him okay will have Samantha sent new script over with Brand name. Pt verbalized understanding.

## 2022-09-08 NOTE — Telephone Encounter (Signed)
John Kline, pt needs Brand only for Adderall XR 25 mg. I have loaded the cart please send Rx. Thanks.

## 2022-09-11 ENCOUNTER — Other Ambulatory Visit: Payer: Self-pay | Admitting: Physician Assistant

## 2022-09-11 MED ORDER — AMPHETAMINE-DEXTROAMPHET ER 25 MG PO CP24
25.0000 mg | ORAL_CAPSULE | Freq: Two times a day (BID) | ORAL | 0 refills | Status: DC
Start: 2022-09-11 — End: 2022-10-28

## 2022-10-28 ENCOUNTER — Encounter: Payer: Self-pay | Admitting: Physician Assistant

## 2022-10-28 ENCOUNTER — Other Ambulatory Visit: Payer: Self-pay | Admitting: Physician Assistant

## 2022-10-28 MED ORDER — AMPHETAMINE-DEXTROAMPHET ER 25 MG PO CP24: 25.0000 mg | ORAL_CAPSULE | Freq: Two times a day (BID) | ORAL | 0 refills | Status: DC

## 2022-11-03 NOTE — Telephone Encounter (Signed)
Please advise 

## 2022-11-04 NOTE — Telephone Encounter (Signed)
Spoke to pt told him Rx for Adderall XR was corrected at the pharmacy as Brand Necessary and the pharmacist checked and it covered by your insurance. So moving forward we must send Rx as DAW. Pt verbalized understanding. Told him pharmacist is changing Rx and getting ready for you to pick up. Also Aldona Bar needs to see you next month. Pt verbalized understanding and said he has an appt on Feb 12 th. Told him okay see you then.

## 2022-11-04 NOTE — Telephone Encounter (Signed)
[facility] spoke to [place], told her calling in regards to pt's Rx for Adderall XR 25 mg. Pt states that you keep dispensing Aphetamine Salts and pt can not tolerate them he gets headaches. Erline Levine said yes, in order to get Adderall XR it will need to be Brand Necessary. She said last Rx said generic, let me check if Brand is covered. Erline Levine said Marca Ancona is covered. Told her okay I will make sure Rx is checked DAW. Erline Levine said she will change Rx now and get ready for pt. Told her okay thank you.

## 2022-11-17 ENCOUNTER — Encounter: Payer: Self-pay | Admitting: Physician Assistant

## 2022-11-17 ENCOUNTER — Ambulatory Visit: Payer: 59 | Admitting: Physician Assistant

## 2022-11-17 VITALS — BP 100/70 | HR 56 | Temp 97.5°F | Ht 69.5 in | Wt 167.0 lb

## 2022-11-17 DIAGNOSIS — Z23 Encounter for immunization: Secondary | ICD-10-CM

## 2022-11-17 DIAGNOSIS — F909 Attention-deficit hyperactivity disorder, unspecified type: Secondary | ICD-10-CM

## 2022-11-17 MED ORDER — AMPHETAMINE-DEXTROAMPHET ER 25 MG PO CP24: 25.0000 mg | ORAL_CAPSULE | Freq: Two times a day (BID) | ORAL | 0 refills | Status: DC

## 2022-11-17 NOTE — Patient Instructions (Addendum)
It was great to see you!  I will refill medication for 3 months When due for refill, I will send in for another 3 months  Let's follow-up in 6 months for physical, sooner if you have concerns.  Take care,  Inda Coke PA-C

## 2022-11-17 NOTE — Progress Notes (Signed)
John Kline is a 27 y.o. male here for a follow up of a pre-existing problem.  History of Present Illness:   Chief Complaint  Patient presents with   ADHD    HPI  ADHD Currently taking Adderall XR 25 mg BID  Doing well Has started doing cardio daily Stopped drinking ETOH and has had significant improvement of mood Working on better portion sizes and limiting processed sugars Denies any concerns with medication  Past Medical History:  Diagnosis Date   ADHD (attention deficit hyperactivity disorder)    dx'd approx 8th grade.  [facility] [place]   Allergy 11/20/2020   Pencillin, Amoxicillin, Sulfa Products, and Zofran   Anxiety disorder    medications included zoloft in high school   History of meningitis age 1   viral   Mild intermittent asthma    childhood; quiescent for the most part as adult     Social History   Tobacco Use   Smoking status: Former    Packs/day: 0.00    Years: 0.00    Total pack years: 0.00    Types: Cigarettes   Smokeless tobacco: Never  Vaping Use   Vaping Use: Never used  Substance Use Topics   Alcohol use: Yes    Alcohol/week: 2.0 standard drinks of alcohol    Types: 2 Standard drinks or equivalent per week    Comment: trying to get down 1-2 drinks per week   Drug use: No    History reviewed. No pertinent surgical history.  Family History  Problem Relation Age of Onset   Graves' disease Mother    Uterine cancer Maternal Grandmother    Arthritis Maternal Grandmother    Early death Maternal Grandmother    Obesity Maternal Grandmother    Alcohol abuse Maternal Grandfather    Early death Maternal Grandfather    Prostate cancer Neg Hx    Colon cancer Neg Hx     Allergies  Allergen Reactions   Zofran [Ondansetron Hcl] Anaphylaxis    Allergic reaction noted at 1833   Amoxicillin    Penicillins Rash   Sulfa Antibiotics Rash    Current Medications:   Current Outpatient Medications:     amphetamine-dextroamphetamine (ADDERALL XR) 25 MG 24 hr capsule, Take 1 capsule by mouth 2 (two) times daily., Disp: 60 capsule, Rfl: 0   Review of Systems:   ROS Negative unless otherwise specified per HPI.  Vitals:   Vitals:   11/17/22 0958  BP: 100/70  Pulse: (!) 56  Temp: (!) 97.5 F (36.4 C)  TempSrc: Temporal  SpO2: 99%  Weight: 167 lb (75.8 kg)  Height: 5' 9.5" (1.765 m)     Body mass index is 24.31 kg/m.  Physical Exam:   Physical Exam Vitals and nursing note reviewed.  Constitutional:      General: He is not in acute distress.    Appearance: He is well-developed. He is not ill-appearing or toxic-appearing.  Cardiovascular:     Rate and Rhythm: Normal rate and regular rhythm.     Pulses: Normal pulses.     Heart sounds: Normal heart sounds, S1 normal and S2 normal.  Pulmonary:     Effort: Pulmonary effort is normal.     Breath sounds: Normal breath sounds.  Skin:    General: Skin is warm and dry.  Neurological:     Mental Status: He is alert.     GCS: GCS eye subscore is 4. GCS verbal subscore is 5. GCS  motor subscore is 6.  Psychiatric:        Speech: Speech normal.        Behavior: Behavior normal. Behavior is cooperative.     Assessment and Plan:   Attention deficit hyperactivity disorder (ADHD), unspecified ADHD type Well controlled Continue Adderall 25 mg XR BID x 6 months PDMP reviewed -- no red flags  Need for prophylactic vaccination with combined diphtheria-tetanus-pertussis (DTP) vaccine Completed today  Inda Coke, PA-C

## 2022-12-03 ENCOUNTER — Other Ambulatory Visit: Payer: Self-pay | Admitting: Physician Assistant

## 2022-12-03 ENCOUNTER — Encounter: Payer: Self-pay | Admitting: Physician Assistant

## 2022-12-03 MED ORDER — AMPHETAMINE-DEXTROAMPHET ER 25 MG PO CP24: 25.0000 mg | ORAL_CAPSULE | Freq: Two times a day (BID) | ORAL | 0 refills | Status: DC

## 2022-12-03 NOTE — Telephone Encounter (Signed)
Left message on voicemail to call office.  

## 2022-12-03 NOTE — Telephone Encounter (Signed)
The Rx is brand name, pharmacist states insurance pays for generic name but is making him pay $150 for brand name now

## 2022-12-03 NOTE — Telephone Encounter (Signed)
Aldona Bar, please resend Brand Name only Adderall XR Rx's to the pharmacy will not cover Generic.

## 2022-12-03 NOTE — Telephone Encounter (Signed)
Pt called back, asked him if he needs Brand name only? Pt said yes and generic was sent in. Told him I will have [place] resend Rx's Brand Name. Pt verbalized understanding.

## 2022-12-04 NOTE — Telephone Encounter (Signed)
[facility] and spoke to pharmacist, just calling to clairify Adderall Rx's for pt. They will pay for generic but not name brand? Pharmacist said Rx is ready Brand Name and is for $ 150.00. Told her to please change to generic. Pharmacist said she will have to return Rx first and then run again thru to see if it covers. Told her okay, I will call back later.

## 2022-12-04 NOTE — Telephone Encounter (Signed)
Pt calling about status of medication Adderall. Told him I contacted the pharmacy and was told Brand name would be $150.00 and I told the pharmacist to change back to generic and run it. Told him I will call pharmacy later to make sure went thru. Pt said it will probably go thru, but I can not tolerate the generic,but will have to try again. Told pt if you have any problems with it again you may have to contact your insurance to do an exception for the medication. Pt verbalized understanding. Told him I will contact pharmacy in a little bit and let you know if you went through. Pt verbalized understanding.

## 2022-12-04 NOTE — Telephone Encounter (Signed)
Called pharmacy and spoke to [place], calling to f/u on Rx for Adderall to make sure changed to generic and went thru insurance. Marzetta Board said, are you sure pt wants generic cause he called yesterday and said Brand Name. Told pt yes, but insurance is charging $150.00 so pt does not want to pay so please fill for generic. I told her I told pt if he does have side effects will need to contact insurance to have an exception done due not on formulary. Stacy verbalized understanding and said she will change Rx and it ran with no charge. Also just make sure tell pt if he needs to change again to Brand he will have to wait 30 days to be filled since it is the same medication. Told her I will let him know.

## 2023-03-04 ENCOUNTER — Encounter: Payer: Self-pay | Admitting: Physician Assistant

## 2023-03-04 ENCOUNTER — Other Ambulatory Visit: Payer: Self-pay | Admitting: Physician Assistant

## 2023-03-04 MED ORDER — AMPHETAMINE-DEXTROAMPHET ER 25 MG PO CP24: 25.0000 mg | ORAL_CAPSULE | Freq: Two times a day (BID) | ORAL | 0 refills | Status: DC

## 2023-03-04 NOTE — Telephone Encounter (Signed)
Pt needs refills for Adderall XR 25 mg capsules BID. Last OV 11/17/2022.

## 2023-05-06 ENCOUNTER — Encounter (INDEPENDENT_AMBULATORY_CARE_PROVIDER_SITE_OTHER): Payer: Self-pay

## 2023-05-12 ENCOUNTER — Encounter: Payer: Self-pay | Admitting: Physician Assistant

## 2023-06-02 ENCOUNTER — Ambulatory Visit (INDEPENDENT_AMBULATORY_CARE_PROVIDER_SITE_OTHER): Payer: 59 | Admitting: Physician Assistant

## 2023-06-02 ENCOUNTER — Encounter: Payer: Self-pay | Admitting: Physician Assistant

## 2023-06-02 VITALS — BP 124/80 | HR 65 | Temp 98.0°F | Ht 69.5 in | Wt 180.4 lb

## 2023-06-02 DIAGNOSIS — R0981 Nasal congestion: Secondary | ICD-10-CM | POA: Diagnosis not present

## 2023-06-02 DIAGNOSIS — F909 Attention-deficit hyperactivity disorder, unspecified type: Secondary | ICD-10-CM | POA: Diagnosis not present

## 2023-06-02 DIAGNOSIS — Z13220 Encounter for screening for lipoid disorders: Secondary | ICD-10-CM

## 2023-06-02 DIAGNOSIS — Z0000 Encounter for general adult medical examination without abnormal findings: Secondary | ICD-10-CM | POA: Diagnosis not present

## 2023-06-02 MED ORDER — AMPHETAMINE-DEXTROAMPHET ER 25 MG PO CP24: 25.0000 mg | ORAL_CAPSULE | Freq: Two times a day (BID) | ORAL | 0 refills | Status: DC

## 2023-06-02 NOTE — Progress Notes (Signed)
Subjective:    John Kline is a 27 y.o. male and is here for a comprehensive physical exam.  HPI  There are no preventive care reminders to display for this patient.   Acute Concerns: Sinus Infection: Complains of sinus pressure and post nasal drip that began 2 days ago.  Endorses possible sick contact through wife's coworkers.  States he has not taken a Covid test, but is pretty sure it's not Covid according to sick contacts.  Denies fever, chills, or sore throat.  Chronic Issues: ADHD Currently taking Adderall 25 mg XR twice daily Denies concerns with this, other than stock issues  Health Maintenance: Immunizations -- UpToDate  Colonoscopy -- N/A PSA -- No results found for: "PSA1", "PSA" Diet -- Not so healthy within past month, working on healthy eating again.  Sleep habits -- Good sleep quality. Exercise -- Recently getting back into exercise.   Weight -- Recent weight history Wt Readings from Last 10 Encounters:  06/02/23 180 lb 6 oz (81.8 kg)  11/17/22 167 lb (75.8 kg)  05/26/22 201 lb 8 oz (91.4 kg)  12/02/21 193 lb 6.1 oz (87.7 kg)  05/31/21 187 lb (84.8 kg)  11/26/20 185 lb (83.9 kg)  10/05/17 173 lb (78.5 kg)  10/09/14 207 lb (93.9 kg)  01/08/13 185 lb (83.9 kg) (86%, Z= 1.08)*   * Growth percentiles are based on CDC (Boys, 2-20 Years) data.   Body mass index is 26.25 kg/m.  Mood -- Recently stressed due to life changes; otherwise no concerns.  Alcohol use --  reports that he does not currently use alcohol.  Tobacco use --  Tobacco Use: Medium Risk (06/02/2023)   Patient History    Smoking Tobacco Use: Former    Smokeless Tobacco Use: Never    Passive Exposure: Not on file    Eligible for Low Dose CT? no  UTD with eye doctor? No; no concerns.  UTD with dentist? Yes.      06/02/2023    2:02 PM  Depression screen PHQ 2/9  Decreased Interest 1  Down, Depressed, Hopeless 1  PHQ - 2 Score 2  Altered sleeping 1  Tired, decreased energy  3  Change in appetite 2  Feeling bad or failure about yourself  1  Trouble concentrating 1  Moving slowly or fidgety/restless 0  Suicidal thoughts 0  PHQ-9 Score 10  Difficult doing work/chores Somewhat difficult    Other providers/specialists: Patient Care Team: Jarold Motto, [place] as PCP - General (Physician Assistant)    PMHx, SurgHx, SocialHx, Medications, and Allergies were reviewed in the Visit Navigator and updated as appropriate.   Past Medical History:  Diagnosis Date   ADHD (attention deficit hyperactivity disorder)    dx'd approx 8th grade.  [facility] [place]   Allergy 11/20/2020   Pencillin, Amoxicillin, Sulfa Products, and Zofran   Anxiety disorder    medications included zoloft in high school   History of meningitis age 89   viral   Mild intermittent asthma    childhood; quiescent for the most part as adult    No past surgical history on file.   Family History  Problem Relation Age of Onset   Graves' disease Mother    Lung disease Mother        ?emphysema/COPD   Bipolar disorder Sister    Bipolar disorder Sister        TWIN   Uterine cancer Maternal Grandmother    Arthritis Maternal Grandmother  Early death Maternal Grandmother    Obesity Maternal Grandmother    Alcohol abuse Maternal Grandfather    Early death Maternal Grandfather    Prostate cancer Neg Hx    Colon cancer Neg Hx     Social History   Tobacco Use   Smoking status: Former    Current packs/day: 0.00    Types: Cigarettes   Smokeless tobacco: Never  Vaping Use   Vaping status: Never Used  Substance Use Topics   Alcohol use: Not Currently   Drug use: No    Review of Systems:   Review of Systems  HENT:         (+) Sinus pressure (+) Post-nasal drip  Musculoskeletal:        (+) Toe pain (left)    Objective:    Vitals:   06/02/23 1352  BP: 124/80  Pulse: 65  Temp: 98 F (36.7 C)  SpO2: 97%    Body mass index is  26.25 kg/m.  General  Alert, cooperative, no distress, appears stated age  Head:  Normocephalic, without obvious abnormality, atraumatic  Eyes:  PERRL, conjunctiva/corneas clear, EOM's intact, fundi benign, both eyes       Ears:  Normal TM's and external ear canals, both ears  Nose: Nares normal, septum midline, mucosa normal, no drainage or sinus tenderness  Throat: Lips, mucosa, and tongue normal; teeth and gums normal  Neck: Supple, symmetrical, trachea midline, no adenopathy;     thyroid:  No enlargement/tenderness/nodules; no carotid bruit or JVD  Back:   Symmetric, no curvature, ROM normal, no CVA tenderness  Lungs:   Clear to auscultation bilaterally, respirations unlabored  Chest wall:  No tenderness or deformity  Heart:  Regular rate and rhythm, S1 and S2 normal, no murmur, rub or gallop  Abdomen:   Soft, non-tender, bowel sounds active all four quadrants, no masses, no organomegaly  Extremities: Extremities normal, atraumatic, no cyanosis or edema  Prostate : Deferred   Skin: Skin color, texture, turgor normal, no rashes or lesions  Lymph nodes: Cervical, supraclavicular, and axillary nodes normal  Neurologic: CNII-XII grossly intact. Normal strength, sensation and reflexes throughout   AssessmentPlan:   Annual physical exam Today patient counseled on age appropriate routine health concerns for screening and prevention, each reviewed and up to date or declined. Immunizations reviewed and up to date or declined. Labs ordered and reviewed. Risk factors for depression reviewed and negative. Hearing function and visual acuity are intact. ADLs screened and addressed as needed. Functional ability and level of safety reviewed and appropriate. Education, counseling and referrals performed based on assessed risks today. Patient provided with a copy of personalized plan for preventive services.  Attention deficit hyperactivity disorder (ADHD), unspecified ADHD type Well  controlled Prescription drug monitoring program reviewed without red flags Continue Adderall XR 25 mg twice daily  Follow-up in 6 month(s), sooner if concerns  Nasal congestion Suspect viral upper respiratory infection (URI) Consider nasal sprays Follow-up if worsens/lack of improvement    I,Emily Lagle,acting as a Neurosurgeon for [employer], PA.,have documented all relevant documentation on the behalf of Jarold Motto, PA,as directed by  Jarold Motto, PA while in the presence of Jarold Motto, [place].  I, Jarold Motto, [place], have reviewed all documentation for this visit. The documentation on 06/02/23 for the exam, diagnosis, procedures, and orders are all accurate and complete.  Jarold Motto, PA-C [facility]

## 2023-06-02 NOTE — Patient Instructions (Addendum)
It was great to see you!  Please go to the lab for blood work.   Our office will call you with your results unless you have chosen to receive results via MyChart.  If your blood work is normal we will follow-up each year for physicals and as scheduled for chronic medical problems.  If anything is abnormal we will treat accordingly and get you in for a follow-up.  Follow-up in 6 months for Adderall refill  Take care,  Lelon Mast

## 2023-06-03 LAB — CBC WITH DIFFERENTIAL/PLATELET
Basophils Absolute: 0 10*3/uL (ref 0.0–0.1)
Basophils Relative: 0.5 % (ref 0.0–3.0)
Eosinophils Absolute: 0.1 10*3/uL (ref 0.0–0.7)
Eosinophils Relative: 1.1 % (ref 0.0–5.0)
HCT: 46.5 % (ref 39.0–52.0)
Hemoglobin: 15.7 g/dL (ref 13.0–17.0)
Lymphocytes Relative: 19.1 % (ref 12.0–46.0)
Lymphs Abs: 1.1 10*3/uL (ref 0.7–4.0)
MCHC: 33.8 g/dL (ref 30.0–36.0)
MCV: 91.5 fl (ref 78.0–100.0)
Monocytes Absolute: 0.4 10*3/uL (ref 0.1–1.0)
Monocytes Relative: 8 % (ref 3.0–12.0)
Neutro Abs: 4 10*3/uL (ref 1.4–7.7)
Neutrophils Relative %: 71.3 % (ref 43.0–77.0)
Platelets: 159 10*3/uL (ref 150.0–400.0)
RBC: 5.08 Mil/uL (ref 4.22–5.81)
RDW: 11.9 % (ref 11.5–15.5)
WBC: 5.6 10*3/uL (ref 4.0–10.5)

## 2023-06-03 LAB — COMPREHENSIVE METABOLIC PANEL
ALT: 21 U/L (ref 0–53)
AST: 22 U/L (ref 0–37)
Albumin: 4.4 g/dL (ref 3.5–5.2)
Alkaline Phosphatase: 66 U/L (ref 39–117)
BUN: 11 mg/dL (ref 6–23)
CO2: 28 meq/L (ref 19–32)
Calcium: 9.5 mg/dL (ref 8.4–10.5)
Chloride: 102 meq/L (ref 96–112)
Creatinine, Ser: 1.17 mg/dL (ref 0.40–1.50)
GFR: 84.21 mL/min (ref >60.00)
Glucose, Bld: 83 mg/dL (ref 70–99)
Potassium: 4.2 meq/L (ref 3.5–5.1)
Sodium: 138 meq/L (ref 135–145)
Total Bilirubin: 0.9 mg/dL (ref 0.2–1.2)
Total Protein: 6.8 g/dL (ref 6.0–8.3)

## 2023-06-03 LAB — LIPID PANEL
Cholesterol: 171 mg/dL (ref 0–200)
HDL: 59.5 mg/dL (ref >39.00)
LDL Cholesterol: 100 mg/dL — ABNORMAL HIGH (ref 0–99)
NonHDL: 111.87
Total CHOL/HDL Ratio: 3
Triglycerides: 60 mg/dL (ref 0.0–149.0)
VLDL: 12 mg/dL (ref 0.0–40.0)

## 2023-07-06 ENCOUNTER — Telehealth: Payer: Self-pay | Admitting: Physician Assistant

## 2023-07-06 ENCOUNTER — Other Ambulatory Visit: Payer: Self-pay | Admitting: Physician Assistant

## 2023-07-06 MED ORDER — AMPHETAMINE-DEXTROAMPHET ER 25 MG PO CP24: 25.0000 mg | ORAL_CAPSULE | Freq: Two times a day (BID) | ORAL | 0 refills | Status: DC

## 2023-07-06 NOTE — Telephone Encounter (Signed)
Pt requesting Rx for Brand name Adderall XR 25 mg.

## 2023-07-06 NOTE — Telephone Encounter (Signed)
Patient states there is a Market researcher of  amphetamine-dextroamphetamine (ADDERALL XR) 25 MG 24 hr capsule.  Patient requests new RX for name brand ADDERALL XR be sent to   [id number] [place], [place] - [address] Phone: [phone]  Fax: [phone]   If questions, Patient requests to be called.

## 2023-07-06 NOTE — Telephone Encounter (Signed)
Called pharmacy and spoke to [facility], told him calling to make sure provider sent in Rx's for Adderall XR 25 mg Brand Name only correct. Bruce said yes, they were sent in correctly. Told him okay, thanks.

## 2023-08-26 ENCOUNTER — Other Ambulatory Visit: Payer: Self-pay | Admitting: Physician Assistant

## 2023-08-26 ENCOUNTER — Telehealth: Payer: Self-pay | Admitting: Physician Assistant

## 2023-08-26 ENCOUNTER — Encounter: Payer: Self-pay | Admitting: *Deleted

## 2023-08-26 MED ORDER — AMPHETAMINE-DEXTROAMPHET ER 30 MG PO CP24: 30.0000 mg | ORAL_CAPSULE | Freq: Two times a day (BID) | ORAL | 0 refills | Status: DC

## 2023-08-26 NOTE — Telephone Encounter (Signed)
Patient states he has found a Pharmacy that has generic Adderall in stock, however, it requires increase from 25 mg to 30 mg.  Patient requests new RX for  amphetamine-dextroamphetamine (ADDERALL XR) 25 MG 24 hr capsule (WITH increase to 30 MG)  Be sent to:  [id number] [place], [place] - [address] Phone: [phone]  Fax: [phone]

## 2023-08-26 NOTE — Telephone Encounter (Signed)
Please see message, pharmacy is updated. Pt wants Adderall XR 30 mg. Last OV 06/02/2023.

## 2023-10-01 ENCOUNTER — Telehealth: Payer: Self-pay

## 2023-10-01 ENCOUNTER — Other Ambulatory Visit: Payer: Self-pay | Admitting: Physician Assistant

## 2023-10-01 MED ORDER — AMPHETAMINE-DEXTROAMPHET ER 30 MG PO CP24: 30.0000 mg | ORAL_CAPSULE | Freq: Two times a day (BID) | ORAL | 0 refills | Status: DC

## 2023-10-01 NOTE — Telephone Encounter (Signed)
Pt requesting refill on:  Medication: amphetamine-dextroamphetamine (ADDERALL XR) 30 MG 24 hr capsule   This is the patient's preferred pharmacy:  [id number] [place], [place] - [address] [place] [address] Phone: [phone] Fax: [phone]

## 2023-10-01 NOTE — Telephone Encounter (Signed)
Thank You.

## 2023-10-01 NOTE — Telephone Encounter (Signed)
Copied from CRM [id number]. Topic: Clinical - Medication Refill >> Oct 01, 2023  9:16 AM Kathryne Eriksson wrote: Most Recent Primary Care Visit:  Provider: Jarold Motto  Department: [facility]  Visit Type: PHYSICAL  Date: 06/02/2023  Medication: amphetamine-dextroamphetamine (ADDERALL XR) 30 MG 24 hr capsule   Has the patient contacted their pharmacy? Yes (Agent: If no, request that the patient contact the pharmacy for the refill. If patient does not wish to contact the pharmacy document the reason why and proceed with request.) (Agent: If yes, when and what did the pharmacy advise?)  Is this the correct pharmacy for this prescription? Yes If no, delete pharmacy and type the correct one.  This is the patient's preferred pharmacy:  [id number] [place], [place] - [address] [place] [address] Phone: [phone] Fax: [phone]   Has the prescription been filled recently? Yes  Is the patient out of the medication? Yes  Has the patient been seen for an appointment in the last year OR does the patient have an upcoming appointment? Yes  Can we respond through MyChart? Yes  Agent: Please be advised that Rx refills may take up to 3 business days. We ask that you follow-up with your pharmacy.

## 2023-11-02 ENCOUNTER — Other Ambulatory Visit: Payer: Self-pay | Admitting: Physician Assistant

## 2023-11-02 MED ORDER — AMPHETAMINE-DEXTROAMPHET ER 30 MG PO CP24: 30.0000 mg | ORAL_CAPSULE | Freq: Two times a day (BID) | ORAL | 0 refills | Status: DC

## 2023-11-02 NOTE — Telephone Encounter (Signed)
LAST APPOINTMENT DATE: 06/02/2023   NEXT APPOINTMENT DATE: 12/07/2023    LAST REFILL: 10/01/2023  QTY:60

## 2023-11-30 ENCOUNTER — Other Ambulatory Visit: Payer: Self-pay | Admitting: Physician Assistant

## 2023-11-30 MED ORDER — AMPHETAMINE-DEXTROAMPHET ER 30 MG PO CP24: 30.0000 mg | ORAL_CAPSULE | Freq: Two times a day (BID) | ORAL | 0 refills | Status: DC

## 2023-11-30 NOTE — Telephone Encounter (Signed)
 Pt requesting Adderall XR 30 mg capsule. Last OV 06/02/2023. Pt is scheduled 12/07/2023 to see you.

## 2023-12-07 ENCOUNTER — Ambulatory Visit: Payer: 59 | Admitting: Physician Assistant

## 2023-12-07 VITALS — BP 110/74 | HR 64 | Temp 97.5°F | Ht 69.5 in | Wt 182.0 lb

## 2023-12-07 DIAGNOSIS — F909 Attention-deficit hyperactivity disorder, unspecified type: Secondary | ICD-10-CM

## 2023-12-07 DIAGNOSIS — R040 Epistaxis: Secondary | ICD-10-CM

## 2023-12-07 MED ORDER — AMPHETAMINE-DEXTROAMPHET ER 30 MG PO CP24: 60.0000 mg | ORAL_CAPSULE | ORAL | 0 refills | Status: DC

## 2023-12-07 MED ORDER — AMPHETAMINE-DEXTROAMPHET ER 30 MG PO CP24
60.0000 mg | ORAL_CAPSULE | ORAL | 0 refills | Status: DC
Start: 2023-12-07 — End: 2024-04-03

## 2023-12-07 NOTE — Progress Notes (Signed)
 John Kline is a 28 y.o. male here for a follow up of a pre-existing problem.  History of Present Illness:   Chief Complaint  Patient presents with   ADHD    Needs refills    HPI  ADHD  Reports compliance and good tolerance of Adderall 25 mg XR twice daily.  Patient states that he was promoted since he was last seen in office. As a result, he's having to fly about once a week.   Frequent epistaxis He has been experiencing increased stress/anxiety due to flying frequently and reports occasionally experiencing nose bleeds when he does. He mainly attributes to pressure and temperature changes.  Nose bleeds tend to occur 3-4x weekly, they would occasionally be gushing.  With that being said, he's been trying to adjust to flying and has been trying to keep is nose lining moisturized.  No further concerns or symptoms at this time.  Past Medical History:  Diagnosis Date   ADHD (attention deficit hyperactivity disorder)    dx'd approx 8th grade.  [facility] [place]   Allergy 11/20/2020   Pencillin, Amoxicillin, Sulfa Products, and Zofran   Anxiety disorder    medications included zoloft in high school   History of meningitis age 51   viral   Mild intermittent asthma    childhood; quiescent for the most part as adult     Social History   Tobacco Use   Smoking status: Former    Current packs/day: 0.00    Types: Cigarettes   Smokeless tobacco: Never  Vaping Use   Vaping status: Never Used  Substance Use Topics   Alcohol use: Not Currently   Drug use: No    No past surgical history on file.  Family History  Problem Relation Age of Onset   Graves' disease Mother    Lung disease Mother        ?emphysema/COPD   Bipolar disorder Sister    Bipolar disorder Sister        TWIN   Uterine cancer Maternal Grandmother    Arthritis Maternal Grandmother    Early death Maternal Grandmother    Obesity Maternal Grandmother    Alcohol  abuse Maternal Grandfather    Early death Maternal Grandfather    Prostate cancer Neg Hx    Colon cancer Neg Hx     Allergies  Allergen Reactions   Zofran [Ondansetron Hcl] Anaphylaxis    Allergic reaction noted at 1833   Amoxicillin    Penicillins Rash   Sulfa Antibiotics Rash    Current Medications:   Current Outpatient Medications:    amphetamine-dextroamphetamine (ADDERALL XR) 30 MG 24 hr capsule, Take 1 capsule (30 mg total) by mouth 2 (two) times daily., Disp: 60 capsule, Rfl: 0   amphetamine-dextroamphetamine (ADDERALL XR) 30 MG 24 hr capsule, Take 2 capsules (60 mg total) by mouth every morning., Disp: 60 capsule, Rfl: 0   [START ON 01/06/2024] amphetamine-dextroamphetamine (ADDERALL XR) 30 MG 24 hr capsule, Take 2 capsules (60 mg total) by mouth every morning., Disp: 60 capsule, Rfl: 0   [START ON 02/05/2024] amphetamine-dextroamphetamine (ADDERALL XR) 30 MG 24 hr capsule, Take 2 capsules (60 mg total) by mouth every morning., Disp: 60 capsule, Rfl: 0   Review of Systems:   Review of Systems  HENT:  Positive for nosebleeds.   Psychiatric/Behavioral:  The patient is nervous/anxious.     Vitals:   Vitals:   12/07/23 1306  BP: 110/74  Pulse: 64  Temp: Marland Kitchen)  97.5 F (36.4 C)  TempSrc: Temporal  SpO2: 98%  Weight: 182 lb (82.6 kg)  Height: 5' 9.5" (1.765 m)     Body mass index is 26.49 kg/m.  Physical Exam:   Physical Exam Vitals and nursing note reviewed.  Constitutional:      General: He is not in acute distress.    Appearance: He is well-developed. He is not ill-appearing or toxic-appearing.  Cardiovascular:     Rate and Rhythm: Normal rate and regular rhythm.     Pulses: Normal pulses.     Heart sounds: Normal heart sounds, S1 normal and S2 normal.  Pulmonary:     Effort: Pulmonary effort is normal.     Breath sounds: Normal breath sounds.  Skin:    General: Skin is warm and dry.  Neurological:     Mental Status: He is alert.     GCS: GCS eye subscore  is 4. GCS verbal subscore is 5. GCS motor subscore is 6.  Psychiatric:        Speech: Speech normal.        Behavior: Behavior normal. Behavior is cooperative.     Assessment and Plan:   Attention deficit hyperactivity disorder (ADHD), unspecified ADHD type Prescription drug monitoring program reviewed -- no red flags Refill Adderall XR 60 mg daily Follow-up in 6 month(s), sooner if concerns  Epistaxis, recurrent Recommend continued efforts at moisturizing nasal passages when traveling If persists, recommend ENT evaluation    Jarold Motto, PA-C  Ladona Mow M Kadhim,acting as a scribe for Jarold Motto, PA.,have documented all relevant documentation on the behalf of Jarold Motto, PA,as directed by  Jarold Motto, PA while in the presence of Jarold Motto, [place].   I, Jarold Motto, [place], have reviewed all documentation for this visit. The documentation on 12/07/23 for the exam, diagnosis, procedures, and orders are all accurate and complete.

## 2024-04-01 ENCOUNTER — Other Ambulatory Visit: Payer: Self-pay | Admitting: Physician Assistant

## 2024-04-01 ENCOUNTER — Encounter: Payer: Self-pay | Admitting: Physician Assistant

## 2024-04-01 NOTE — Telephone Encounter (Signed)
 Message already sent to provider for refill on Adderall .

## 2024-04-01 NOTE — Telephone Encounter (Signed)
 Pt requesting refill for Adderall  XR 30 mg, 2 capsules each morning. Last OV 12/2023.

## 2024-04-01 NOTE — Telephone Encounter (Signed)
 Copied from CRM [id number]. Topic: Clinical - Medication Refill >> Apr 01, 2024 10:08 AM Robinson H wrote: Medication: amphetamine -dextroamphetamine (ADDERALL  XR) 30 MG 24 hr capsule  Has the patient contacted their pharmacy? Yes, controlled substance reach out to provider (Agent: If no, request that the patient contact the pharmacy for the refill. If patient does not wish to contact the pharmacy document the reason why and proceed with request.) (Agent: If yes, when and what did the pharmacy advise?)  This is the patient's preferred pharmacy:  [id number] [place], [place] - [address] [place] [address] Phone: [phone] Fax: [phone]  Is this the correct pharmacy for this prescription? Yes If no, delete pharmacy and type the correct one.   Has the prescription been filled recently? No  Is the patient out of the medication? No, 1 pill left  Has the patient been seen for an appointment in the last year OR does the patient have an upcoming appointment? Yes  Can we respond through MyChart? Yes  Agent: Please be advised that Rx refills may take up to 3 business days. We ask that you follow-up with your pharmacy.

## 2024-04-03 ENCOUNTER — Other Ambulatory Visit: Payer: Self-pay | Admitting: Physician Assistant

## 2024-04-03 MED ORDER — AMPHETAMINE-DEXTROAMPHET ER 30 MG PO CP24: 60.0000 mg | ORAL_CAPSULE | ORAL | 0 refills | Status: DC

## 2024-05-12 ENCOUNTER — Encounter: Payer: Self-pay | Admitting: Physician Assistant

## 2024-05-13 NOTE — Telephone Encounter (Signed)
 Forwarding to [place] to review and advise.

## 2024-06-08 ENCOUNTER — Ambulatory Visit: Admitting: Physician Assistant

## 2024-06-08 ENCOUNTER — Encounter: Payer: Self-pay | Admitting: Physician Assistant

## 2024-06-08 VITALS — BP 110/76 | HR 60 | Temp 97.3°F | Ht 69.5 in | Wt 186.5 lb

## 2024-06-08 DIAGNOSIS — F909 Attention-deficit hyperactivity disorder, unspecified type: Secondary | ICD-10-CM | POA: Diagnosis not present

## 2024-06-08 MED ORDER — AMPHETAMINE-DEXTROAMPHET ER 30 MG PO CP24: 60.0000 mg | ORAL_CAPSULE | ORAL | 0 refills | Status: DC

## 2024-06-08 NOTE — Progress Notes (Signed)
 John Kline is a 28 y.o. male here for a follow up of a pre-existing problem.  History of Present Illness:   Chief Complaint  Patient presents with   ADHD    Pt currently taking Adderall  XR 30 mg. Tolerating well.    ADHD Currently taking Adderall  XR 60 mg daily. Tolerating well Reports there is no palpitations/insomnia/worsening anxiety with prescription     Past Medical History:  Diagnosis Date   ADHD (attention deficit hyperactivity disorder)    dx'd approx 8th grade.  [facility] [place].   Allergy 11/20/2020   Pencillin, Amoxicillin, Sulfa Products, and Zofran    Anxiety disorder    medications included zoloft in high school   History of meningitis age 38   viral   Mild intermittent asthma    childhood; quiescent for the most part as adult     Social History   Tobacco Use   Smoking status: Former    Current packs/day: 0.00    Types: Cigarettes   Smokeless tobacco: Never  Vaping Use   Vaping status: Never Used  Substance Use Topics   Alcohol use: Not Currently   Drug use: No    No past surgical history on file.  Family History  Problem Relation Age of Onset   Graves' disease Mother    Lung disease Mother        ?emphysema/COPD   Bipolar disorder Sister    Bipolar disorder Sister        TWIN   Uterine cancer Maternal Grandmother    Arthritis Maternal Grandmother    Early death Maternal Grandmother    Obesity Maternal Grandmother    Alcohol abuse Maternal Grandfather    Early death Maternal Grandfather    Prostate cancer Neg Hx    Colon cancer Neg Hx     Allergies  Allergen Reactions   Zofran  [Ondansetron  Hcl] Anaphylaxis    Allergic reaction noted at 1833   Amoxicillin    Penicillins Rash   Sulfa Antibiotics Rash    Current Medications:   Current Outpatient Medications:    amphetamine -dextroamphetamine (ADDERALL  XR) 30 MG 24 hr capsule, Take 2 capsules (60 mg total) by mouth every morning., Disp:  60 capsule, Rfl: 0   amphetamine -dextroamphetamine (ADDERALL  XR) 30 MG 24 hr capsule, Take 2 capsules (60 mg total) by mouth every morning., Disp: 60 capsule, Rfl: 0   amphetamine -dextroamphetamine (ADDERALL  XR) 30 MG 24 hr capsule, Take 2 capsules (60 mg total) by mouth every morning., Disp: 60 capsule, Rfl: 0   Review of Systems:   Negative unless otherwise specified per HPI.  Vitals:   Vitals:   06/08/24 1046  BP: 110/76  Pulse: 60  Temp: (!) 97.3 F (36.3 C)  TempSrc: Temporal  SpO2: 99%  Weight: 186 lb 8 oz (84.6 kg)  Height: 5' 9.5 (1.765 m)     Body mass index is 27.15 kg/m.  Physical Exam:   Physical Exam Vitals and nursing note reviewed.  Constitutional:      Appearance: He is well-developed.  HENT:     Head: Normocephalic.  Eyes:     Conjunctiva/sclera: Conjunctivae normal.     Pupils: Pupils are equal, round, and reactive to light.  Pulmonary:     Effort: Pulmonary effort is normal.  Musculoskeletal:        General: Normal range of motion.     Cervical back: Normal range of motion.  Skin:    General: Skin is warm and dry.  Neurological:     Mental Status: He is alert and oriented to person, place, and time.  Psychiatric:        Behavior: Behavior normal.        Thought Content: Thought content normal.        Judgment: Judgment normal.     Assessment and Plan:   Attention deficit hyperactivity disorder (ADHD), unspecified ADHD type Well controlled PDMP reviewed during this encounter. Refill Adderall  XR 60 mg daily Follow up in 3 month(s), sooner if concerns    Lucie Buttner, PA-C

## 2024-06-13 ENCOUNTER — Encounter: Admitting: Physician Assistant

## 2024-07-13 ENCOUNTER — Ambulatory Visit (INDEPENDENT_AMBULATORY_CARE_PROVIDER_SITE_OTHER): Admitting: Physician Assistant

## 2024-07-13 ENCOUNTER — Encounter: Payer: Self-pay | Admitting: Physician Assistant

## 2024-07-13 VITALS — BP 120/76 | HR 69 | Temp 98.8°F | Ht 69.5 in | Wt 179.0 lb

## 2024-07-13 DIAGNOSIS — F909 Attention-deficit hyperactivity disorder, unspecified type: Secondary | ICD-10-CM

## 2024-07-13 DIAGNOSIS — Z0000 Encounter for general adult medical examination without abnormal findings: Secondary | ICD-10-CM

## 2024-07-13 DIAGNOSIS — E785 Hyperlipidemia, unspecified: Secondary | ICD-10-CM

## 2024-07-13 NOTE — Progress Notes (Signed)
 Subjective:    John Kline is a 28 y.o. male and is here for a comprehensive physical exam.  HPI  There are no preventive care reminders to display for this patient.  ADHD Doing well with Adderall  XR 60 mg daily Reports there is no concerns Using new pharmacy and having zero issues with this!  Health Maintenance: Immunizations -- declined flu shot Colonoscopy -- N/A  PSA -- No results found for: PSA1, PSA -- N/A  Diet -- overall well balanced Sleep habits -- not sleeping well -- has tried melatonin in the past and considering restarting Exercise -- regular exercise -- at least one hour daily  Weight -- Weight: 179 lb (81.2 kg)  Recent weight history Wt Readings from Last 10 Encounters:  07/13/24 179 lb (81.2 kg)  06/08/24 186 lb 8 oz (84.6 kg)  12/07/23 182 lb (82.6 kg)  06/02/23 180 lb 6 oz (81.8 kg)  11/17/22 167 lb (75.8 kg)  05/26/22 201 lb 8 oz (91.4 kg)  12/02/21 193 lb 6.1 oz (87.7 kg)  05/31/21 187 lb (84.8 kg)  11/26/20 185 lb (83.9 kg)  10/05/17 173 lb (78.5 kg)   Body mass index is 26.05 kg/m.  Mood -- no major concerns Alcohol use --  reports that he does not currently use alcohol.  Tobacco use --  Tobacco Use: Medium Risk (07/13/2024)   Patient History    Smoking Tobacco Use: Former    Smokeless Tobacco Use: Never    Passive Exposure: Not on file    Eligible for Low Dose CT? no  UTD with eye doctor? no UTD with dentist? yes     07/13/2024    1:26 PM  Depression screen PHQ 2/9  Decreased Interest 0  Down, Depressed, Hopeless 0  PHQ - 2 Score 0    Other providers/specialists: Patient Care Team: Job Lukes, [place] as PCP - General (Physician Assistant)    PMHx, SurgHx, SocialHx, Medications, and Allergies were reviewed in the Visit Navigator and updated as appropriate.   Past Medical History:  Diagnosis Date   ADHD (attention deficit hyperactivity disorder)    dx'd approx 8th grade.  [facility] [place].   Allergy 11/20/2020   Pencillin, Amoxicillin, Sulfa Products, and Zofran    Anxiety disorder    medications included zoloft in high school   History of meningitis age 81   viral   Mild intermittent asthma    childhood; quiescent for the most part as adult    No past surgical history on file.   Family History  Problem Relation Age of Onset   Graves' disease Mother    Lung disease Mother        ?emphysema/COPD   Bipolar disorder Sister    Bipolar disorder Sister        TWIN   Uterine cancer Maternal Grandmother    Arthritis Maternal Grandmother    Early death Maternal Grandmother    Obesity Maternal Grandmother    Alcohol abuse Maternal Grandfather    Early death Maternal Grandfather    Prostate cancer Neg Hx    Colon cancer Neg Hx     Social History   Tobacco Use   Smoking status: Former    Current packs/day: 0.00    Types: Cigarettes   Smokeless tobacco: Never  Vaping Use   Vaping status: Never Used  Substance Use Topics   Alcohol use: Not Currently   Drug use: No    Review of Systems:  Review of Systems  Constitutional:  Negative for chills, fever, malaise/fatigue and weight loss.  HENT:  Negative for hearing loss, sinus pain and sore throat.   Respiratory:  Negative for cough and hemoptysis.   Cardiovascular:  Negative for chest pain, palpitations, leg swelling and PND.  Gastrointestinal:  Negative for abdominal pain, constipation, diarrhea, heartburn, nausea and vomiting.  Genitourinary:  Negative for dysuria, frequency and urgency.  Musculoskeletal:  Negative for back pain, myalgias and neck pain.  Skin:  Negative for itching and rash.  Neurological:  Negative for dizziness, tingling, seizures and headaches.  Endo/Heme/Allergies:  Negative for polydipsia.  Psychiatric/Behavioral:  Negative for depression. The patient is not nervous/anxious.     Objective:    Vitals:   07/13/24 1324  BP: 120/76  Pulse: 69  Temp:  98.8 F (37.1 C)  SpO2: 98%    Body mass index is 26.05 kg/m.  General  Alert, cooperative, no distress, appears stated age  Head:  Normocephalic, without obvious abnormality, atraumatic  Eyes:  PERRL, conjunctiva/corneas clear, EOM's intact, fundi benign, both eyes       Ears:  Normal TM's and external ear canals, both ears  Nose: Nares normal, septum midline, mucosa normal, no drainage or sinus tenderness  Throat: Lips, mucosa, and tongue normal; teeth and gums normal  Neck: Supple, symmetrical, trachea midline, no adenopathy;     thyroid:  No enlargement/tenderness/nodules; no carotid bruit or JVD  Back:   Symmetric, no curvature, ROM normal, no CVA tenderness  Lungs:   Clear to auscultation bilaterally, respirations unlabored  Chest wall:  No tenderness or deformity  Heart:  Regular rate and rhythm, S1 and S2 normal, no murmur, rub or gallop  Abdomen:   Soft, non-tender, bowel sounds active all four quadrants, no masses, no organomegaly  Extremities: Extremities normal, atraumatic, no cyanosis or edema  Prostate : Deferred   Skin: Skin color, texture, turgor normal, no rashes or lesions  Lymph nodes: Cervical, supraclavicular, and axillary nodes normal  Neurologic: CNII-XII grossly intact. Normal strength, sensation and reflexes throughout   AssessmentPlan:   Routine physical examination Today patient counseled on age appropriate routine health concerns for screening and prevention, each reviewed and up to date or declined. Immunizations reviewed and up to date or declined. Labs ordered and reviewed. Risk factors for depression reviewed and negative. Hearing function and visual acuity are intact. ADLs screened and addressed as needed. Functional ability and level of safety reviewed and appropriate. Education, counseling and referrals performed based on assessed risks today. Patient provided with a copy of personalized plan for preventive services.  Dyslipidemia Update lipid  panel and provide recommendations   Attention deficit hyperactivity disorder (ADHD), unspecified ADHD type Well controlled per patient Continue Adderall  XR 60 mg daily Follow up in 3 month(s), sooner if concerns     Lucie Buttner, PA-C [facility]

## 2024-09-29 ENCOUNTER — Encounter: Payer: Self-pay | Admitting: Physician Assistant

## 2024-09-30 ENCOUNTER — Other Ambulatory Visit: Payer: Self-pay | Admitting: Physician Assistant

## 2024-09-30 MED ORDER — AMPHETAMINE-DEXTROAMPHET ER 30 MG PO CP24: 60.0000 mg | ORAL_CAPSULE | ORAL | 0 refills | Status: DC

## 2024-09-30 NOTE — Telephone Encounter (Signed)
 Copied from CRM [id number]. Topic: Clinical - Medication Refill >> Sep 30, 2024  9:19 AM Brittany M wrote: Medication: amphetamine -dextroamphetamine (ADDERALL  XR) 30 MG 24 hr capsule  Has the patient contacted their pharmacy? Yes (Agent: If no, request that the patient contact the pharmacy for the refill. If patient does not wish to contact the pharmacy document the reason why and proceed with request.) (Agent: If yes, when and what did the pharmacy advise?)  This is the patient's preferred pharmacy:  [id number] [place], [place] - [address] [place] [address] Phone: [phone] Fax: [phone]  Is this the correct pharmacy for this prescription? Yes If no, delete pharmacy and type the correct one.   Has the prescription been filled recently? Yes  Is the patient out of the medication? Yes  Has the patient been seen for an appointment in the last year OR does the patient have an upcoming appointment? Yes  Can we respond through MyChart? Yes  Agent: Please be advised that Rx refills may take up to 3 business days. We ask that you follow-up with your pharmacy.

## 2024-09-30 NOTE — Telephone Encounter (Signed)
 Last OV: 07/13/2024  Next OV: 10/18/2023  Last Refill: 06/08/2024  Dispense: 60/0

## 2024-10-17 ENCOUNTER — Ambulatory Visit: Admitting: Physician Assistant

## 2024-10-17 VITALS — BP 126/78 | HR 67 | Temp 98.4°F | Ht 69.5 in | Wt 178.0 lb

## 2024-10-17 DIAGNOSIS — F5101 Primary insomnia: Secondary | ICD-10-CM | POA: Diagnosis not present

## 2024-10-17 DIAGNOSIS — F909 Attention-deficit hyperactivity disorder, unspecified type: Secondary | ICD-10-CM

## 2024-10-17 MED ORDER — AMPHETAMINE-DEXTROAMPHET ER 30 MG PO CP24: 60.0000 mg | ORAL_CAPSULE | ORAL | 0 refills | Status: AC

## 2024-10-17 NOTE — Progress Notes (Signed)
 "  History of Present Illness:   Chief Complaint  Patient presents with   ADHD    Pt here for 3 month f/u Addderall XR 60 mg.    Discussed the use of AI scribe software for clinical note transcription with the patient, who gave verbal consent to proceed.  History of Present Illness   John Kline is a 29 year old male who presents with sleep disturbances and concerns about upcoming travel.  He has had 3 weeks of significant sleep disturbance with difficulty falling and staying asleep, lying awake for long periods. He has tried cutting out caffeine, melatonin (which sometimes causes morning grogginess), reducing screen time before bed, and using a white noise machine, with inconsistent improvement. He has no chest pain or palpitations. He is not using alcohol or workout supplements. He is taking his Adderall  XR 60 in am and tolerating well.  He is working on murphy oil with improved diet and focus on body composition. He notes a smaller waist size without weight loss. He uses a smart watch and scale to track health metrics, including blood pressure, which he notes is slightly higher than his usual but not significantly elevated.        Past Medical History:  Diagnosis Date   ADHD (attention deficit hyperactivity disorder)    dx'd approx 8th grade.  [facility] [place]   Allergy 11/20/2020   Pencillin, Amoxicillin, Sulfa Products, and Zofran    Anxiety disorder    medications included zoloft in high school   History of meningitis age 89   viral   Mild intermittent asthma    childhood; quiescent for the most part as adult     Social History[1]  No past surgical history on file.  Family History  Problem Relation Age of Onset   Graves' disease Mother    Lung disease Mother        ?emphysema/COPD   Bipolar disorder Sister    Bipolar disorder Sister        TWIN   Uterine cancer Maternal Grandmother    Arthritis Maternal  Grandmother    Early death Maternal Grandmother    Obesity Maternal Grandmother    Alcohol abuse Maternal Grandfather    Early death Maternal Grandfather    Prostate cancer Neg Hx    Colon cancer Neg Hx     Allergies[2]  Current Medications:  Current Medications[3]   Review of Systems:   Negative unless otherwise specified per HPI.  Vitals:   Vitals:   10/17/24 1023  BP: 126/78  Pulse: 67  Temp: 98.4 F (36.9 C)  TempSrc: Temporal  SpO2: 98%  Weight: 178 lb (80.7 kg)  Height: 5' 9.5 (1.765 m)     Body mass index is 25.91 kg/m.  Physical Exam:   Physical Exam Vitals and nursing note reviewed.  Constitutional:      General: He is not in acute distress.    Appearance: He is well-developed. He is not ill-appearing or toxic-appearing.  Cardiovascular:     Rate and Rhythm: Normal rate and regular rhythm.     Pulses: Normal pulses.     Heart sounds: Normal heart sounds, S1 normal and S2 normal.  Pulmonary:     Effort: Pulmonary effort is normal.     Breath sounds: Normal breath sounds.  Skin:    General: Skin is warm and dry.  Neurological:     Mental Status: He is alert.     GCS: GCS  eye subscore is 4. GCS verbal subscore is 5. GCS motor subscore is 6.  Psychiatric:        Speech: Speech normal.        Behavior: Behavior normal. Behavior is cooperative.     Assessment and Plan:   Assessment and Plan    Attention-deficit hyperactivity disorder Exploring natural alternatives for travel. - Consider Brain MD supplement for travel. - Continue omega-3 supplements and tea for concentration. - PDMP reviewed - no red flags - Continue Adderall  XR 60 mg in AM - FU in 3 month(s)   Insomnia Chronic insomnia persists despite lifestyle changes. Melatonin causes grogginess. - Provided sleep hygiene handout. - Advised to leave bed after 20 minutes of wakefulness for non-stimulating activity. - Continue avoiding screens before bedtime. - Use white noise machine as  needed. - Continue to monitor   Lucie Buttner, PA-C    [1]  Social History Tobacco Use   Smoking status: Former    Current packs/day: 0.00    Types: Cigarettes   Smokeless tobacco: Never  Vaping Use   Vaping status: Never Used  Substance Use Topics   Alcohol use: Not Currently   Drug use: No  [2]  Allergies Allergen Reactions   Zofran  [Ondansetron  Hcl] Anaphylaxis    Allergic reaction noted at 1833   Amoxicillin    Penicillins Rash   Sulfa Antibiotics Rash  [3]  Current Outpatient Medications:    amphetamine -dextroamphetamine (ADDERALL  XR) 30 MG 24 hr capsule, Take 2 capsules (60 mg total) by mouth every morning., Disp: 60 capsule, Rfl: 0  "

## 2024-10-17 NOTE — Patient Instructions (Addendum)
Sleep Hygiene  Do: (1) Go to bed at the same time each day. (2) Get up from bed at the same time each day. (3) Get regular exercise each day, preferably in the morning.  There is goof evidence that regular exercise improves restful sleep.  This includes stretching and aerobic exercise. (4) Get regular exposure to outdoor or bright lights, especially in the late afternoon. (5) Keep the temperature in your bedroom comfortable. (6) Keep the bedroom quiet when sleeping. (7) Keep the bedroom dark enough to facilitate sleep. (8) Use your bed only for sleep and sex. (9) Take medications as directed.  It is helpful to take prescribed sleeping pills 1 hour before bedtime, so they are causing drowsiness when you lie down, or 10 hours before getting up, to avoid daytime drowsiness. (10) Use a relaxation exercise just before going to sleep -- imagery, massage, warm bath. (11) Keep your feet and hands warm.  Wear warm socks and/or mittens or gloves to bed.  Don't: (1) Exercise just before going to bed. (2) Engage in stimulating activity just before bed, such as playing a competitive game, watching an exciting program on television, or having an important discussion with a loved one. (3) Have caffeine in the evening (coffee, teas, chocolate, sodas, etc.) (4) Read or watch television in bed. (5) Use alcohol to help you sleep. (6) Go to bed too hungry or too full. (7) Take another person's sleeping pills. (8) Take over-the-counter sleeping pills, without your doctor's knowledge.  Tolerance can develop rapidly with these medications.  Diphenhydramine can have serious side effects for elderly patients. (9) Take daytime naps. (10) Command yourself to go to sleep.  This only makes your mind and body more alert.  If you lie awake for more than 20-30 minutes, get up, go to a different room, participate in a quiet activity (Ex - non-excitable reading or television), and then return to bed when you feel sleepy.   Do this as many times during the night as needed.  This may cause you to have a night or two of poor sleep but it will train your brain to know when it is time for sleep.

## 2024-10-27 ENCOUNTER — Telehealth: Payer: Self-pay | Admitting: *Deleted

## 2024-10-27 ENCOUNTER — Telehealth: Payer: Self-pay

## 2024-10-27 ENCOUNTER — Other Ambulatory Visit (HOSPITAL_COMMUNITY): Payer: Self-pay

## 2024-10-27 NOTE — Telephone Encounter (Signed)
 Pharmacy Patient Advocate Encounter   Received notification from Physician's Office that prior authorization for Amphetamine -Dextroamphet ER 30MG  er capsules is required/requested.   Insurance verification completed.   The patient is insured through [facility].   Per test claim: PA required; PA submitted to above mentioned insurance via Latent Key/confirmation #/EOC [facility] Status is pending

## 2024-10-27 NOTE — Telephone Encounter (Signed)
 Noted.

## 2024-10-27 NOTE — Telephone Encounter (Signed)
 Please do PA doe Adderall  XR 30 mg, 60 mg each morning. Thanks.

## 2024-10-27 NOTE — Telephone Encounter (Signed)
 Pharmacy Patient Advocate Encounter  Received notification from [facility] that Prior Authorization for Amphetamine -Dextroamphet ER 30MG  er capsules  has been APPROVED from 10/27/24 to 10/27/27. Ran test claim, Copay is $0.00. This test claim was processed through [facility]- copay amounts may vary at other pharmacies due to pharmacy/plan contracts, or as the patient moves through the different stages of their insurance plan.   PA #/Case ID/Reference #: [id number]  .

## 2025-02-14 ENCOUNTER — Ambulatory Visit: Admitting: Physician Assistant
